# Patient Record
Sex: Male | Born: 1975 | Race: White | Hispanic: No | Marital: Single | Smoking: Never smoker
Health system: Southern US, Community
[De-identification: ages and names within clinical notes are randomized; demographics above are authoritative.]

## PROBLEM LIST (undated history)

## (undated) DIAGNOSIS — E119 Type 2 diabetes mellitus without complications: Secondary | ICD-10-CM

## (undated) DIAGNOSIS — K219 Gastro-esophageal reflux disease without esophagitis: Secondary | ICD-10-CM

## (undated) DIAGNOSIS — B019 Varicella without complication: Secondary | ICD-10-CM

## (undated) HISTORY — PX: SHOULDER SURGERY: SHX246

## (undated) HISTORY — DX: Varicella without complication: B01.9

## (undated) HISTORY — DX: Gastro-esophageal reflux disease without esophagitis: K21.9

---

## 2006-05-27 ENCOUNTER — Emergency Department (HOSPITAL_COMMUNITY): Admission: EM | Admit: 2006-05-27 | Discharge: 2006-05-27 | Payer: Self-pay | Admitting: Emergency Medicine

## 2008-09-11 ENCOUNTER — Encounter: Payer: Self-pay | Admitting: Gastroenterology

## 2008-10-07 ENCOUNTER — Encounter: Payer: Self-pay | Admitting: Gastroenterology

## 2008-10-19 ENCOUNTER — Ambulatory Visit: Payer: Self-pay | Admitting: Gastroenterology

## 2008-10-19 DIAGNOSIS — K219 Gastro-esophageal reflux disease without esophagitis: Secondary | ICD-10-CM | POA: Insufficient documentation

## 2008-10-19 DIAGNOSIS — R740 Nonspecific elevation of levels of transaminase and lactic acid dehydrogenase [LDH]: Secondary | ICD-10-CM

## 2008-10-19 DIAGNOSIS — R112 Nausea with vomiting, unspecified: Secondary | ICD-10-CM | POA: Insufficient documentation

## 2008-10-19 DIAGNOSIS — R7401 Elevation of levels of liver transaminase levels: Secondary | ICD-10-CM | POA: Insufficient documentation

## 2008-10-23 ENCOUNTER — Ambulatory Visit: Payer: Self-pay | Admitting: Gastroenterology

## 2008-10-23 ENCOUNTER — Ambulatory Visit (HOSPITAL_COMMUNITY): Admission: RE | Admit: 2008-10-23 | Discharge: 2008-10-23 | Payer: Self-pay | Admitting: Gastroenterology

## 2008-10-23 LAB — CONVERTED CEMR LAB
HDL: 23.3 mg/dL — ABNORMAL LOW (ref >39.0)
Total CHOL/HDL Ratio: 6.4

## 2010-07-28 ENCOUNTER — Emergency Department (HOSPITAL_COMMUNITY): Admission: EM | Admit: 2010-07-28 | Discharge: 2010-07-28 | Payer: Self-pay | Admitting: Emergency Medicine

## 2011-09-08 ENCOUNTER — Emergency Department (HOSPITAL_COMMUNITY)
Admission: EM | Admit: 2011-09-08 | Discharge: 2011-09-08 | Disposition: A | Discharge status: Home / Self Care | Payer: Worker's Compensation | Attending: Emergency Medicine | Admitting: Emergency Medicine

## 2011-09-08 ENCOUNTER — Emergency Department (HOSPITAL_COMMUNITY): Payer: Worker's Compensation

## 2011-09-08 DIAGNOSIS — S6980XA Other specified injuries of unspecified wrist, hand and finger(s), initial encounter: Secondary | ICD-10-CM | POA: Insufficient documentation

## 2011-09-08 DIAGNOSIS — Y990 Civilian activity done for income or pay: Secondary | ICD-10-CM | POA: Insufficient documentation

## 2011-09-08 DIAGNOSIS — M79609 Pain in unspecified limb: Secondary | ICD-10-CM | POA: Insufficient documentation

## 2011-09-08 DIAGNOSIS — S6990XA Unspecified injury of unspecified wrist, hand and finger(s), initial encounter: Secondary | ICD-10-CM | POA: Insufficient documentation

## 2011-09-08 DIAGNOSIS — X500XXA Overexertion from strenuous movement or load, initial encounter: Secondary | ICD-10-CM | POA: Insufficient documentation

## 2011-09-08 DIAGNOSIS — S6390XA Sprain of unspecified part of unspecified wrist and hand, initial encounter: Secondary | ICD-10-CM | POA: Insufficient documentation

## 2012-08-03 ENCOUNTER — Ambulatory Visit (INDEPENDENT_AMBULATORY_CARE_PROVIDER_SITE_OTHER): Payer: 59 | Admitting: Family Medicine

## 2012-08-03 VITALS — BP 132/88 | HR 61 | Temp 98.8°F | Resp 18 | Ht 74.0 in | Wt 310.0 lb

## 2012-08-03 DIAGNOSIS — L738 Other specified follicular disorders: Secondary | ICD-10-CM

## 2012-08-03 DIAGNOSIS — L739 Follicular disorder, unspecified: Secondary | ICD-10-CM

## 2012-08-03 DIAGNOSIS — L02419 Cutaneous abscess of limb, unspecified: Secondary | ICD-10-CM

## 2012-08-03 MED ORDER — DOXYCYCLINE HYCLATE 100 MG PO TABS: 100.0000 mg | ORAL_TABLET | Freq: Two times a day (BID) | ORAL | Status: AC

## 2012-08-03 MED ORDER — MUPIROCIN 2 % EX OINT: TOPICAL_OINTMENT | Freq: Three times a day (TID) | CUTANEOUS | Status: AC

## 2012-08-03 NOTE — Patient Instructions (Signed)
Take medication as ordered. If it looks at all worse at any time please return for recheck.

## 2012-08-03 NOTE — Progress Notes (Signed)
Subjective: Approximately one month ago the patient noticed a placed on his left leg. It came up and he eventually drained it, improve, then it got worse again the last few days. He drained it again. His last night secondary spots started opening up next to it. He didn't know if he got something in it or had been bitten by a spider. He also has some low placed in the back of his Be checked.  Objective: Folliculitis on the back of his neck. A small place was opened and cultured.  Left shin has a 3 mm moderately deep crater. Just. To that there is a new little crusted draining area. The surrounding tissue is red but not very hot looking. No deeper abscess can be palpated. A small amount of drainage to be obtained through the main cavity.  Assessment: Abscess left shin Folliculitis on neck  Plan: Cultured both places. This is suspicious for MRSA. Will cover with doxycycline and Bactroban. Return if worse.

## 2012-08-06 LAB — WOUND CULTURE
Gram Stain: NONE SEEN
Gram Stain: NONE SEEN
Gram Stain: NONE SEEN
Organism ID, Bacteria: NO GROWTH

## 2012-08-08 MED ORDER — CEPHALEXIN 500 MG PO TABS: 500.0000 mg | ORAL_TABLET | Freq: Three times a day (TID) | ORAL | Status: AC

## 2012-08-08 NOTE — Addendum Note (Signed)
Addended by: Johnnette Litter on: 08/08/2012 09:49 AM   Modules accepted: Orders

## 2013-07-24 ENCOUNTER — Emergency Department: Payer: Self-pay | Admitting: Internal Medicine

## 2013-07-24 LAB — URINALYSIS, COMPLETE
Glucose,UR: >=500 mg/dL (ref 0–75)
Leukocyte Esterase: NEGATIVE
Ph: 6 (ref 4.5–8.0)
RBC,UR: 102 /HPF (ref 0–5)
Specific Gravity: 1.008 (ref 1.003–1.030)
Squamous Epithelial: NONE SEEN

## 2013-07-24 LAB — BASIC METABOLIC PANEL
Chloride: 108 mmol/L — ABNORMAL HIGH (ref 98–107)
Creatinine: 0.92 mg/dL (ref 0.60–1.30)
EGFR (African American): >60
Glucose: 203 mg/dL — ABNORMAL HIGH (ref 65–99)
Potassium: 3.5 mmol/L (ref 3.5–5.1)
Sodium: 140 mmol/L (ref 136–145)

## 2013-07-24 LAB — CBC
HGB: 16.4 g/dL (ref 13.0–18.0)
Platelet: 247 10*3/uL (ref 150–440)

## 2016-06-04 ENCOUNTER — Emergency Department (HOSPITAL_COMMUNITY)
Admission: EM | Admit: 2016-06-04 | Discharge: 2016-06-04 | Disposition: A | Discharge status: Home / Self Care | Payer: Worker's Compensation | Attending: Emergency Medicine | Admitting: Emergency Medicine

## 2016-06-04 ENCOUNTER — Encounter (HOSPITAL_COMMUNITY): Payer: Self-pay | Admitting: Nurse Practitioner

## 2016-06-04 ENCOUNTER — Emergency Department (HOSPITAL_COMMUNITY): Payer: Worker's Compensation

## 2016-06-04 DIAGNOSIS — S8262XA Displaced fracture of lateral malleolus of left fibula, initial encounter for closed fracture: Secondary | ICD-10-CM | POA: Diagnosis not present

## 2016-06-04 DIAGNOSIS — S82892A Other fracture of left lower leg, initial encounter for closed fracture: Secondary | ICD-10-CM

## 2016-06-04 DIAGNOSIS — W101XXA Fall (on)(from) sidewalk curb, initial encounter: Secondary | ICD-10-CM | POA: Diagnosis not present

## 2016-06-04 DIAGNOSIS — S80211A Abrasion, right knee, initial encounter: Secondary | ICD-10-CM | POA: Insufficient documentation

## 2016-06-04 DIAGNOSIS — Y92481 Parking lot as the place of occurrence of the external cause: Secondary | ICD-10-CM | POA: Insufficient documentation

## 2016-06-04 DIAGNOSIS — Y999 Unspecified external cause status: Secondary | ICD-10-CM | POA: Diagnosis not present

## 2016-06-04 DIAGNOSIS — S99912A Unspecified injury of left ankle, initial encounter: Secondary | ICD-10-CM | POA: Diagnosis present

## 2016-06-04 DIAGNOSIS — Y939 Activity, unspecified: Secondary | ICD-10-CM | POA: Insufficient documentation

## 2016-06-04 DIAGNOSIS — Z791 Long term (current) use of non-steroidal anti-inflammatories (NSAID): Secondary | ICD-10-CM | POA: Insufficient documentation

## 2016-06-04 MED ORDER — IBUPROFEN 400 MG PO TABS
800.0000 mg | ORAL_TABLET | Freq: Once | ORAL | Status: AC
  Administered 2016-06-04: 800 mg via ORAL
  Filled 2016-06-04: qty 2

## 2016-06-04 MED ORDER — IBUPROFEN 800 MG PO TABS: 800.0000 mg | ORAL_TABLET | Freq: Four times a day (QID) | ORAL | Status: DC | PRN

## 2016-06-04 NOTE — Progress Notes (Signed)
Orthopedic Tech Progress Note Patient Details:  David HoesDavid J Tift Horton 02-06-1976 [id number] Applied CAM walker to LLE.  Pulses, sensation, motion intact before and after application.  Capillary refill less than 2 seconds before and after application. Ortho Devices Type of Ortho Device: CAM walker Ortho Device/Splint Location: LLE Ortho Device/Splint Interventions: Application   Lesle ChrisGilliland, Suhailah Kwan L 06/04/2016, 6:29 PM

## 2016-06-04 NOTE — Discharge Instructions (Signed)
You have a broken left ankle.  Please wear cam walker and follow up closely with orthopedist for further care.  Follow instruction below.   Walking Boot A walking boot (controlled ankle motion boot or CAM walker) is a removable boot-shaped splint that holds your foot or ankle in place after an injury or a medical procedure. This helps with healing and prevents further injury. A walking boot has a stiff, rigid outer frame that limits movement and supports your leg and foot. The inner lining is a layer of padded material. Walking boots usually have several adjustable straps to secure them over the foot. Your health care provider may prescribe a walking boot if it is okay for you to use your injured foot to support your body weight. How much you can walk with the boot on will depend on the type and severity of your injury. Your health care provider will recommend the best boot for you based on your condition. HOW DO I PUT ON MY WALKING BOOT? There are different types of walking boots. Each type of boot has specific instructions about how to wear it properly. Follow instructions from your health care provider about wearing yours. In general:  Sit down to put on your boot. This is more comfortable and it helps to prevent falls.  Open up the boot fully. Place your foot into the boot so that your heel rests against the back.  Your toes should be supported by the base of the boot, but they should not hang over the front.  Adjust the straps so the boot fits securely but is not too tight.  Do not bend the hard frame of the boot to get a good fit.  Ask someone to help you put on the boot, if needed. WHAT ARE SOME TIPS FOR WALKING WITH A WALKING BOOT?  Do not try to walk without wearing the boot unless your health care provider has approved.  Rest your injured leg as much as possible.  Use other assistive walking devices as told by your health care provider. These include crutches and canes.  On your  other foot, wear a shoe with a heel that is close to the height of the boot.  Be very careful when walking on surfaces that are uneven or wet. HOW CAN I REDUCE SWELLING?  Rest your injured foot or leg as much as possible.  If directed, apply ice to the injured area:  Put ice in a plastic bag.  Place a towel between your skin and the bag.  Leave the ice on for 20 minutes, 2-3 times a day for two days or as told by your health care provider.  Keep your injured leg raised (elevated) above the level of your heart for 2-3 hours each day or as told by your health care provider.  If swelling gets worse, loosen the boot and rest and raise your foot.  Contact your health care provider if swelling does not get better or if it gets worse over time. WHAT SKIN CARE PRACTICES SHOULD I FOLLOW?  Wear a long sock to protect your foot and leg from rubbing inside the boot.  Take off the boot one time per day to check the injured area.  Follow instructions from your health care provider about taking care of your incision or wound, if this applies.  Clean and wash the injured area as told by your health care provider.  Gently dry your foot and leg before putting the boot back on.  Contact your health care provider if a wound is getting worse or if your skin becomes red, painful, or irritated. ARE THERE ANY ACTIVITY RESTRICTIONS? Activity restrictions depend on the type and severity of your injury. Follow instructions from your health care provider.  Bathe and shower as directed by your health care provider.  Do not do activities that could make your injury worse.  Do not drive if your affected foot is one that you usually use for driving. HOW SHOULD I KEEP MY BOOT CLEAN?  Clean the frame and the liner of the boot by hand. Use a washcloth with mild soap and water.  Do not use chemical cleaning products. These could irritate your skin, especially if you have a wound or an incision.  Do not  soak the liner of the boot.  Do not put any part of the boot in a washing machine or a clothes dryer.  Allow the boot to air dry completely before you put it back on your foot.   This information is not intended to replace advice given to you by your health care provider. Make sure you discuss any questions you have with your health care provider.   Document Released: 04/17/2015 Document Reviewed: 04/17/2015 Elsevier Interactive Patient Education 2016 [employer]   Fibular Fracture With Rehab The fibula is the smaller of the two lower leg bones and is vulnerable to breaks (fracture). Fibular fractures may go fully through the bone (complete) or partially (incomplete). The bone fragments are rarely out of alignment (displaced fracture). Fibula fractures may occur anywhere along the bone. However, this document only discusses fractures that do not involve a leg joint. Fibular fractures are not often a severe injury because the bone supports only about 17% of the body weight. SYMPTOMS   Moderate to severe pain in the lower leg.  Tenderness and swelling in the leg or calf.  Bleeding and/or bruising (contusion) in the leg.  Inability to bear weight on the injured extremity.  Visible deformity, if the fracture is displaced.  Numbness and coldness in the leg and foot, beyond the fracture site, if blood supply is impaired. CAUSES  Fractures occur when a force is placed on the bone that is greater than it can withstand. Common causes of fibular fracture include:  Direct hit (trauma) (i.e., hockey or lacrosse check to the lower leg).  Stress fracture (weakening of the bone from repeated stress).  Indirect injury, caused by twisting, turning quickly, or violent muscle contraction. RISK INCREASES WITH:  Contact sports (i.e., football, soccer, lacrosse, hockey).  Sports that can cause twisted ankle injury (i.e., skiing, basketball).  Bony abnormalities (i.e., osteoporosis or bone  tumors).  Metabolism disorders, hormone problems, and nutrition deficiency and disorders (i.e., anorexia and bulimia).  Poor strength and flexibility. PREVENTION   Warm up and stretch properly before activity.  Maintain physical fitness:  Strength, flexibility, and endurance.  Cardiovascular fitness.  Wear properly fitted and padded protective equipment (i.e., shin guards for soccer). PROGNOSIS  If treated properly, fibular fractures usually heal in 4 to 6 weeks.  RELATED COMPLICATIONS   Failure of bone to heal (nonunion).  Bone heals in a poor position (malunion).  Increased pressure inside the leg (compartment syndrome) due to injury that disrupts the blood supply to the leg and foot and injures the nerves and muscles (uncommon).  Shortening of the injured bones.  Hindrance of normal bone growth in children.  Risks of surgery: infection, bleeding, injury to nerves (numbness, weakness, paralysis), need for  further surgery.  Longer healing time if activity is resumed too soon. TREATMENT Treatment first involves ice, medicine, and elevation of the leg to reduce pain and inflammation. People with fibular fractures are advised to walk using crutches. A brace or walking boot may be given to restrain the injured leg and allow for healing. Sometimes, surgery is needed to place a rod, plate, or screws in the bones in order to fix the fracture. After surgery, the leg is restrained. After restraint (with or without surgery), it is important to complete strengthening and stretching exercises to regain strength and a full range of motion. Exercises may be completed at home or with a therapist. MEDICATION   If pain medicine is needed, nonsteroidal anti-inflammatory medicines (aspirin and ibuprofen), or other minor pain relievers (acetaminophen), are often advised.  Do not take pain medicine for 7 days before surgery.  Prescription pain relievers may be given if your health care provider  thinks they are needed. Use only as directed and only as much as you need. SEEK MEDICAL CARE IF:  Symptoms get worse or do not improve in 2 weeks, despite treatment.  The following occur after restraint or surgery. (Report any of these signs immediately):  Swelling above or below the fracture site.  Severe, persistent pain.  Blue or gray skin below the fracture site, especially under the toenails. Numbness or loss of feeling below the fracture site.  New, unexplained symptoms develop. (Drugs used in treatment may produce side effects.) EXERCISES  RANGE OF MOTION (ROM) AND STRETCHING EXERCISES - Fibular Fracture These exercises may help you when beginning to recover from your injury. Your symptoms may go away with or without further involvement from your physician, physical therapist or athletic trainer. While completing these exercises, remember:   Restoring tissue flexibility helps normal motion to return to the joints. This allows healthier, less painful movement and activity.  An effective stretch should be held for at least 30 seconds.  A stretch should never be painful. You should only feel a gentle lengthening or release in the stretched tissue. RANGE OF MOTION - Dorsi/Plantar Flexion  While sitting with your right / left knee straight, draw the top of your foot upwards by flexing your ankle. Then reverse the motion, pointing your toes downward.  Hold each position for __________ seconds.  After completing your first set of exercises, repeat this exercise with your knee bent. Repeat __________ times. Complete this exercise __________ times per day.  STRETCH - Gastrocsoleus   Sit with your right / left leg extended. Holding onto both ends of a belt or towel, loop it around the ball of your foot.  Keeping your right / left ankle and foot relaxed and your knee straight, pull your foot and ankle toward you using the belt.  You should feel a gentle stretch behind your calf or  knee. Hold this position for __________ seconds. Repeat __________ times. Complete this stretch __________ times per day.  RANGE OF MOTION- Ankle Plantar Flexion   Sit with your right / left leg crossed over your opposite knee.  Use your opposite hand to pull the top of your foot and toes toward you.  You should feel a gentle stretch on the top of your foot and ankle. Hold this position for __________ seconds. Repeat __________ times. Complete __________ times per day.  RANGE OF MOTION - Ankle Eversion  Sit with your right / left ankle crossed over your opposite knee.  Grip your foot with your opposite hand,  placing your thumb on the top of your foot and your fingers across the bottom of your foot.  Gently push your foot downward with a slight rotation so your littlest toes rise slightly toward the ceiling.  You should feel a gentle stretch on the inside of your ankle. Hold the stretch for __________ seconds. Repeat __________ times. Complete this exercise __________ times per day.  RANGE OF MOTION - Ankle Inversion  Sit with your right / left ankle crossed over your opposite knee.  Grip your foot with your opposite hand, placing your thumb on the bottom of your foot and your fingers across the top of your foot.  Gently pull your foot so the smallest toe comes toward you and your thumb pushes the inside of the ball of your foot away from you.  You should feel a gentle stretch on the outside of your ankle. Hold the stretch for __________ seconds. Repeat __________ times. Complete this exercise __________ times per day.  RANGE OF MOTION - Ankle Alphabet  Imagine your right / left big toe is a pen.  Keeping your hip and knee still, write out the entire alphabet with your "pen." Make the letters as large as you can, without increasing any discomfort. Repeat __________ times. Complete this exercise __________ times per day.  RANGE OF MOTION - Ankle Dorsiflexion, Active  Assisted  Remove your shoes and sit on a chair, preferably not on a carpeted surface.  Place your right / left foot on the floor, directly under your knee. Extend your opposite leg for support.  Keeping your heel down, slide your right / left foot back toward the chair, until you feel a stretch at your ankle or calf. If you do not feel a stretch, slide your bottom forward to the edge of the chair, while still keeping your heel down.  Hold this stretch for __________ seconds. Repeat __________ times. Complete this stretch __________ times per day.  STRENGTHENING EXERCISES - Fibular Fracture These exercises may help you when beginning to recover from your injury. They may resolve your symptoms with or without further involvement from your physician, physical therapist or athletic trainer. While completing these exercises, remember:   Muscles can gain both the endurance and the strength needed for everyday activities through controlled exercises.  Complete these exercises as instructed by your physician, physical therapist or athletic trainer. Increase the resistance and repetitions only as guided.  You may experience muscle soreness or fatigue, but the pain or discomfort you are trying to eliminate should never worsen during these exercises. If this pain does get worse, stop and make certain you are following the directions exactly. If the pain is still present after adjustments, discontinue the exercise until you can discuss the trouble with your clinician. STRENGTH - Dorsiflexors  Secure a rubber exercise band or tubing to a fixed object (table, pole) and loop the other end around your right / left foot.  Sit on the floor, facing the fixed object. The band should be slightly tense when your foot is relaxed.  Slowly draw your foot back toward you, using your ankle and toes.  Hold this position for __________ seconds. Slowly release the tension in the band and return your foot to the starting  position. Repeat __________ times. Complete this exercise __________ times per day.  STRENGTH - Plantar-flexors  Sit with your right / left leg extended. Holding onto both ends of a rubber exercise band or tubing, loop it around the ball of your foot.  Keep a slight tension in the band.  Slowly push your toes away from you, pointing them downward.  Hold this position for __________ seconds. Return to the starting position slowly, controlling the tension in the band. Repeat __________ times. Complete this exercise __________ times per day.  STRENGTH - Plantar-flexors, Standing   Stand with your feet shoulder width apart. Place your hands on a wall or table to steady yourself, using as little support as needed.  Keeping your weight evenly spread over the width of your feet, rise up on your toes.*  Hold this position for __________ seconds. Repeat __________ times. Complete this exercise __________ times per day.  *If this is too easy, shift your weight toward your right / left leg until you feel challenged. Ultimately, you may be asked to do this exercise while standing on your right / left foot only. STRENGTH - Towel Curls  Sit in a chair, on a non-carpeted surface.  Place your foot on a towel, keeping your heel on the floor.  Pull the towel toward your heel only by curling your toes. Keep your heel on the floor.  If instructed by your physician, physical therapist or athletic trainer, add ____________________ at the end of the towel. Repeat __________ times. Complete this exercise __________ times per day. STRENGTH - Ankle Eversion  Secure one end of a rubber exercise band or tubing to a fixed object (table, pole). Loop the other end around your foot, just before your toes.  Place your fists between your knees. This will focus your strengthening at your ankle.  Drawing the band across your opposite foot, away from the pole, slowly pull your little toe out and up. Make sure the band  is positioned to resist the entire motion.  Hold this position for __________ seconds.  Return to the starting position slowly, controlling the tension in the band. Repeat __________ times. Complete this exercise __________ times per day.  STRENGTH - Ankle Inversion  Secure one end of a rubber exercise band or tubing to a fixed object (table, pole). Loop the other end around your foot, just before your toes.  Place your fists between your knees. This will focus your strengthening at your ankle.  Slowly, pull your big toe up and in, making sure the band is positioned to resist the entire motion.  Hold this position for __________ seconds.  Return to the starting position slowly, controlling the tension in the band. Repeat __________ times. Complete this exercises __________ times per day.    This information is not intended to replace advice given to you by your health care provider. Make sure you discuss any questions you have with your health care provider.   Document Released: 12/01/2005 Document Revised: 12/22/2014 Document Reviewed: 03/15/2009 Elsevier Interactive Patient Education [employer]

## 2016-06-04 NOTE — ED Notes (Signed)
Pt c/o onset L ankle pain after rolling his ankle on a parking lot curb this afternoon. Describes as a dull ache. He is concerned due to past history of injury to this ankle

## 2016-06-04 NOTE — ED Provider Notes (Signed)
CSN: [id number]     Arrival date & time 06/04/16  1643 History  By signing my name below, I, Soijett Blue, attest that this documentation has been prepared under the direction and in the presence of Fayrene HelperBowie Zissy Hamlett, PA-C Electronically Signed: Soijett Blue, ED Scribe. 06/04/2016. 5:24 PM.   Chief Complaint  Patient presents with  . Ankle Pain      The history is provided by the patient. No language interpreter was used.    David Horton is a 40 y.o. male who presents to the Emergency Department complaining of 8/10, constant, dull aching, non-radiating, left ankle pain, onset 1 hour ago. Pt states that he was ambulating when his left ankle rolled on a curb. Pt states that he fell into a car during the fall and obtained an abrasion to his right knee. Pt reports that he has fractured his left ankle in the past. Pt is having associated symptoms of left ankle swelling, gait problem due to pain, and abrasion to right knee. He notes that he has not tried any medications for the relief of his symptoms. He denies color change, wound, rash, and any other symptoms.    History reviewed. No pertinent past medical history. Past Surgical History  Procedure Laterality Date  . Shoulder surgery     History reviewed. No pertinent family history. Social History  Substance Use Topics  . Smoking status: Never Smoker   . Smokeless tobacco: None  . Alcohol Use: Yes    Review of Systems  Musculoskeletal: Positive for joint swelling (left ankle) and arthralgias (left ankle). Gait problem: due to pain.  Skin: Positive for wound (abrasion to right knee). Negative for color change and rash.      Allergies  Review of patient's allergies indicates no known allergies.  Home Medications   Prior to Admission medications   Medication Sig Start Date End Date Taking? Authorizing Provider  naproxen (NAPROSYN) 500 MG tablet Take 500 mg by mouth 2 (two) times daily as needed.    Historical Provider, MD   BP  151/104 mmHg  Pulse 102  Temp(Src) 99.1 F (37.3 C) (Oral)  Resp 17  SpO2 96% Physical Exam  Constitutional: He is oriented to person, place, and time. He appears well-developed and well-nourished. No distress.  HENT:  Head: Normocephalic and atraumatic.  Eyes: EOM are normal.  Neck: Neck supple.  Cardiovascular: Normal rate and intact distal pulses.   Pulmonary/Chest: Effort normal. No respiratory distress.  Abdominal: He exhibits no distension.  Musculoskeletal: Normal range of motion.       Left knee: Normal.       Left ankle: Tenderness. Lateral malleolus tenderness found.  Left ankle tenderness noted to lateral malleolus with moderate effusion noted anteriorly with TTP, but no crepitus. No pain at the 5th metatarsal regions. dorsalis pedis pulses palpable. Able to move all toes. Brisk cap refill. No tenderness to medial malleolus. Nl plantar and dorsi flexion. Calf compartment soft. Left knee nl. Small abrasion noted to right knee but able to bend and flex knee.   Neurological: He is alert and oriented to person, place, and time.  Skin: Skin is warm and dry.  Psychiatric: He has a normal mood and affect. His behavior is normal.  Nursing note and vitals reviewed.   ED Course  Procedures (including critical care time) DIAGNOSTIC STUDIES: Oxygen Saturation is 96% on RA, nl by my interpretation.    COORDINATION OF CARE: 5:19 PM Discussed treatment plan with pt at bedside which  includes ibuprofen and left ankle xray and pt agreed to plan.    Imaging Review Dg Ankle Complete Left  06/04/2016  CLINICAL DATA:  Injury to left ankle today when mistepped in parking lot at work and twisted ankle. Pain and swelling lateral malleolus. EXAM: LEFT ANKLE COMPLETE - 3+ VIEW COMPARISON:  12/13/2013 by report only FINDINGS: Subtle cortical irregularity at the tip of the lateral malleolus, possible minimally distracted or displaced avulsion injury. There is overlying soft tissue swelling.  Otherwise normal mineralization and alignment. The ankle mortise is intact. No other fracture is evident. Small calcaneal spurs. IMPRESSION: 1. Suspect minimally displaced avulsion fracture from the lateral malleolus. Correlate with point tenderness. Electronically Signed   By: Corlis Leak M.D.   On: 06/04/2016 17:56   I have personally reviewed and evaluated these images as part of my medical decision-making.    MDM   Final diagnoses:  Closed left ankle fracture, initial encounter    Patient X-Ray positive for minimally displaced avulsion fracture from lateral malleolus. Pt advised to follow up with orthopedics. Patient given cam-walker boot while in ED. Will discharge home with ibuprofen. Conservative therapy recommended and discussed. Patient will be discharged home & is agreeable with above plan. Returns precautions discussed. Pt appears safe for discharge.  Care discussed with Dr. Donnald Garre.   I personally performed the services described in this documentation, which was scribed in my presence. The recorded information has been reviewed and is accurate.       Fayrene Helper, PA-C 06/04/16 1812  Arby Barrette, MD 06/05/16 [phone]

## 2016-08-09 ENCOUNTER — Emergency Department (HOSPITAL_COMMUNITY)
Admission: EM | Admit: 2016-08-09 | Discharge: 2016-08-09 | Disposition: A | Discharge status: Home / Self Care | Payer: Worker's Compensation | Attending: Emergency Medicine | Admitting: Emergency Medicine

## 2016-08-09 ENCOUNTER — Encounter (HOSPITAL_COMMUNITY): Payer: Self-pay | Admitting: Emergency Medicine

## 2016-08-09 DIAGNOSIS — Y999 Unspecified external cause status: Secondary | ICD-10-CM | POA: Diagnosis not present

## 2016-08-09 DIAGNOSIS — Y939 Activity, unspecified: Secondary | ICD-10-CM | POA: Diagnosis not present

## 2016-08-09 DIAGNOSIS — Y929 Unspecified place or not applicable: Secondary | ICD-10-CM | POA: Insufficient documentation

## 2016-08-09 DIAGNOSIS — S60811A Abrasion of right wrist, initial encounter: Secondary | ICD-10-CM | POA: Insufficient documentation

## 2016-08-09 DIAGNOSIS — Z7721 Contact with and (suspected) exposure to potentially hazardous body fluids: Secondary | ICD-10-CM | POA: Diagnosis not present

## 2016-08-09 LAB — RAPID HIV SCREEN (HIV 1/2 AB+AG)
HIV 1/2 ANTIBODIES: NONREACTIVE
HIV-1 P24 ANTIGEN - HIV24: NONREACTIVE

## 2016-08-09 MED ORDER — IBUPROFEN 800 MG PO TABS
800.0000 mg | ORAL_TABLET | Freq: Once | ORAL | Status: AC
  Administered 2016-08-09: 800 mg via ORAL
  Filled 2016-08-09: qty 1

## 2016-08-09 MED ORDER — IBUPROFEN 800 MG PO TABS: 800.0000 mg | ORAL_TABLET | Freq: Three times a day (TID) | ORAL | 0 refills | Status: DC
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
## 2016-08-09 NOTE — ED Notes (Signed)
Per Lanier ClamCarolyn Chappin [facility] [phone] we need to draw HBV, HCV and HIV on both exposed patient and source patient and fax Exposure report to [phone]. Sgt Miller here assist

## 2016-08-09 NOTE — ED Notes (Signed)
Lab Results: HIV Non-reactive. RN made aware.

## 2016-08-09 NOTE — Discharge Instructions (Signed)
You were seen in the ED today with a low-risk blood exposure. You were HIV negative today. You will need to follow with your company and your PCP in the next 4-6 weeks for re-check of labs.   Return to the ED with any sudden worsening pain, fever, chills, or difficulty breathing.
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
## 2016-08-10 LAB — HEPATITIS B SURFACE ANTIGEN: HEP B S AG: NEGATIVE

## 2016-08-10 LAB — HEPATITIS C ANTIBODY

## 2016-08-14 LAB — HEPATITIS B SURFACE ANTIBODY,QUALITATIVE: Hep B S Ab: REACTIVE

## 2017-05-13 ENCOUNTER — Encounter (HOSPITAL_COMMUNITY): Payer: Self-pay

## 2017-05-13 ENCOUNTER — Emergency Department (HOSPITAL_COMMUNITY)
Admission: EM | Admit: 2017-05-13 | Discharge: 2017-05-13 | Disposition: A | Discharge status: Home / Self Care | Payer: Worker's Compensation | Attending: Emergency Medicine | Admitting: Emergency Medicine

## 2017-05-13 ENCOUNTER — Emergency Department (HOSPITAL_COMMUNITY): Payer: Worker's Compensation

## 2017-05-13 DIAGNOSIS — Y939 Activity, unspecified: Secondary | ICD-10-CM | POA: Diagnosis not present

## 2017-05-13 DIAGNOSIS — Y990 Civilian activity done for income or pay: Secondary | ICD-10-CM | POA: Insufficient documentation

## 2017-05-13 DIAGNOSIS — S46911A Strain of unspecified muscle, fascia and tendon at shoulder and upper arm level, right arm, initial encounter: Secondary | ICD-10-CM

## 2017-05-13 DIAGNOSIS — W502XXA Accidental twist by another person, initial encounter: Secondary | ICD-10-CM | POA: Insufficient documentation

## 2017-05-13 DIAGNOSIS — S4991XA Unspecified injury of right shoulder and upper arm, initial encounter: Secondary | ICD-10-CM | POA: Diagnosis present

## 2017-05-13 DIAGNOSIS — Y929 Unspecified place or not applicable: Secondary | ICD-10-CM | POA: Insufficient documentation

## 2017-05-13 MED ORDER — IBUPROFEN 800 MG PO TABS
800.0000 mg | ORAL_TABLET | Freq: Once | ORAL | Status: AC
  Administered 2017-05-13: 800 mg via ORAL
  Filled 2017-05-13: qty 1
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
## 2017-05-13 NOTE — Discharge Instructions (Signed)
As discussed, follow the RICE protocol and follow up with your orthopedic doctor if symptoms persist. Ibuprofen for pain and swelling. Return if symptoms worsen, you experience loss of sensation in that arm or any other concerning symptoms in the meantime.

## 2017-05-13 NOTE — ED Notes (Signed)
PT DISCHARGED. INSTRUCTIONS GIVEN. AAOX4. PT IN NO APPARENT DISTRESS WITH MILD PAIN. THE OPPORTUNITY TO ASK QUESTIONS WAS PROVIDED. 
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
## 2017-05-14 MED ORDER — BUPIVACAINE HCL (PF) 0.5 % IJ SOLN
INTRAMUSCULAR | Status: AC
  Filled 2017-05-14: qty 30

## 2017-05-14 MED ORDER — IOPAMIDOL (ISOVUE-300) INJECTION 61%
INTRAVENOUS | Status: AC
  Filled 2017-05-14: qty 50
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
## 2018-04-27 ENCOUNTER — Encounter (INDEPENDENT_AMBULATORY_CARE_PROVIDER_SITE_OTHER): Payer: Self-pay

## 2018-04-27 ENCOUNTER — Encounter: Payer: Self-pay | Admitting: Primary Care

## 2018-04-27 ENCOUNTER — Ambulatory Visit: Payer: Self-pay | Admitting: Primary Care

## 2018-04-27 VITALS — BP 140/88 | HR 72 | Temp 98.2°F | Ht 74.0 in | Wt 256.0 lb

## 2018-04-27 DIAGNOSIS — R030 Elevated blood-pressure reading, without diagnosis of hypertension: Secondary | ICD-10-CM | POA: Diagnosis not present

## 2018-04-27 DIAGNOSIS — N529 Male erectile dysfunction, unspecified: Secondary | ICD-10-CM

## 2018-04-27 DIAGNOSIS — Z23 Encounter for immunization: Secondary | ICD-10-CM | POA: Diagnosis not present

## 2018-04-27 DIAGNOSIS — Z113 Encounter for screening for infections with a predominantly sexual mode of transmission: Secondary | ICD-10-CM

## 2018-04-27 DIAGNOSIS — I10 Essential (primary) hypertension: Secondary | ICD-10-CM | POA: Insufficient documentation

## 2018-04-27 DIAGNOSIS — K219 Gastro-esophageal reflux disease without esophagitis: Secondary | ICD-10-CM

## 2018-04-27 DIAGNOSIS — Z0000 Encounter for general adult medical examination without abnormal findings: Secondary | ICD-10-CM | POA: Diagnosis not present

## 2018-04-27 LAB — COMPREHENSIVE METABOLIC PANEL
ALK PHOS: 52 U/L (ref 39–117)
ALT: 16 U/L (ref 0–53)
AST: 13 U/L (ref 0–37)
Albumin: 4.4 g/dL (ref 3.5–5.2)
BUN: 21 mg/dL (ref 6–23)
CO2: 27 mEq/L (ref 19–32)
Calcium: 9.4 mg/dL (ref 8.4–10.5)
Chloride: 106 mEq/L (ref 96–112)
Creatinine, Ser: 0.78 mg/dL (ref 0.40–1.50)
GFR: 117.42 mL/min (ref >60.00)
GLUCOSE: 194 mg/dL — AB (ref 70–99)
Potassium: 4.5 mEq/L (ref 3.5–5.1)
Sodium: 143 mEq/L (ref 135–145)
TOTAL PROTEIN: 7.4 g/dL (ref 6.0–8.3)
Total Bilirubin: 1.5 mg/dL — ABNORMAL HIGH (ref 0.2–1.2)

## 2018-04-27 LAB — LIPID PANEL
Cholesterol: 166 mg/dL (ref 0–200)
HDL: 37.5 mg/dL — AB (ref >39.00)
NONHDL: 128.44
TRIGLYCERIDES: 226 mg/dL — AB (ref 0.0–149.0)
Total CHOL/HDL Ratio: 4
VLDL: 45.2 mg/dL — AB (ref 0.0–40.0)

## 2018-04-27 LAB — LDL CHOLESTEROL, DIRECT: Direct LDL: 109 mg/dL

## 2018-04-27 NOTE — Progress Notes (Signed)
Subjective:    Patient ID: David Horton, male    DOB: 03-23-1976, 42 y.o.   MRN: [id number]  HPI  Mr. Abdou Stocks is a 42 year male who presents today to establish care and for complete physical. He would also like screening for STD's and testing for testosterone levels.   He's having difficulty maintaining an erection that has been noticeable since beocming intimate in a new relationship two weeks ago. He denies difficulty obtaining an erection. He's never experienced this problem before. He is divorced from his wife, has not had intercourse since around 2015.   Immunizations: -Tetanus: Due today   Diet: He endorses  Breakfast: Meal replacement shake Lunch: Fish, rice, vegetables, beans Dinner: Fish, rice, beans Snacks: Meal replacement shake Desserts: Occasional chocolate Beverages: Energy drink, water, occasional orange juice, some alcohol use  Exercise: Riding his bike 4 days week, works out at the gyn 4-6 times weekly.  Eye exam: Completed years ago Dental exam: No recent exam   Review of Systems  Constitutional: Negative for unexpected weight change.  HENT: Negative for rhinorrhea.   Respiratory: Negative for cough and shortness of breath.   Cardiovascular: Negative for chest pain.  Gastrointestinal: Negative for constipation and diarrhea.  Genitourinary: Negative for difficulty urinating.  Musculoskeletal: Negative for arthralgias and myalgias.  Skin: Negative for rash.  Allergic/Immunologic: Negative for environmental allergies.  Neurological: Negative for dizziness, numbness and headaches.  Psychiatric/Behavioral: The patient is not nervous/anxious.        Past Medical History:  Diagnosis Date  . Chicken pox   . GERD (gastroesophageal reflux disease)      Social History   Socioeconomic History  . Marital status: Divorced    Spouse name: Not on file  . Number of children: Not on file  . Years of education: Not on file  . Highest education  level: Not on file  Occupational History  . Not on file  Social Needs  . Financial resource strain: Not on file  . Food insecurity:    Worry: Not on file    Inability: Not on file  . Transportation needs:    Medical: Not on file    Non-medical: Not on file  Tobacco Use  . Smoking status: Never Smoker  . Smokeless tobacco: Never Used  Substance and Sexual Activity  . Alcohol use: Yes  . Drug use: No  . Sexual activity: Not on file  Lifestyle  . Physical activity:    Days per week: Not on file    Minutes per session: Not on file  . Stress: Not on file  Relationships  . Social connections:    Talks on phone: Not on file    Gets together: Not on file    Attends religious service: Not on file    Active member of club or organization: Not on file    Attends meetings of clubs or organizations: Not on file    Relationship status: Not on file  . Intimate partner violence:    Fear of current or ex partner: Not on file    Emotionally abused: Not on file    Physically abused: Not on file    Forced sexual activity: Not on file  Other Topics Concern  . Not on file  Social History Narrative   Divorced.   2 children.   Works for [employer].   Enjoys working out.     Past Surgical History:  Procedure Laterality Date  . SHOULDER  SURGERY Left 2000, 2011   Left torn Labrum    Family History  Problem Relation Age of Onset  . Diabetes Mellitus II Mother   . COPD Father   . Diabetes Mellitus II Father   . Diabetes Mellitus I Sister   . Diabetes Mellitus II Maternal Grandmother   . Alzheimer's disease Maternal Grandmother     No Known Allergies  No current outpatient medications on file prior to visit.   No current facility-administered medications on file prior to visit.     BP 140/88   Pulse 72   Temp 98.2 F (36.8 C) (Oral)   Ht  (1.88 m)   Wt 256 lb (116.1 kg)   SpO2 98%   BMI 32.87 kg/m    Objective:   Physical Exam    Constitutional: He is oriented to person, place, and time. He appears well-nourished.  HENT:  Right Ear: Tympanic membrane and ear canal normal.  Left Ear: Tympanic membrane and ear canal normal.  Nose: Nose normal. Right sinus exhibits no maxillary sinus tenderness and no frontal sinus tenderness. Left sinus exhibits no maxillary sinus tenderness and no frontal sinus tenderness.  Mouth/Throat: Oropharynx is clear and moist.  Eyes: Pupils are equal, round, and reactive to light. Conjunctivae and EOM are normal.  Neck: Neck supple. Carotid bruit is not present. No thyromegaly present.  Cardiovascular: Normal rate, regular rhythm and normal heart sounds.  Pulmonary/Chest: Effort normal and breath sounds normal. He has no wheezes. He has no rales.  Abdominal: Soft. Bowel sounds are normal. There is no tenderness.  Musculoskeletal: Normal range of motion.  Neurological: He is alert and oriented to person, place, and time. He has normal reflexes. No cranial nerve deficit.  Skin: Skin is warm and dry.  Psychiatric: He has a normal mood and affect.          Assessment & Plan:

## 2018-04-27 NOTE — Assessment & Plan Note (Signed)
Intermittent and improved since weight loss. He is aware of trigger foods.

## 2018-04-27 NOTE — Patient Instructions (Signed)
Stop by the lab prior to leaving today. I will notify you of your results once received.   Schedule another lab appointment to return between the hours of 8 am and 10 am for the testosterone draw.   Continue exercising. You should be getting 150 minutes of moderate intensity exercise weekly.  Continue to work on a healthy diet, congratulations on weight loss!  It was a pleasure to meet you today! Please don't hesitate to call or message me with any questions. Welcome to [facility]!    Preventive Care 18-39 Years, Male Preventive care refers to lifestyle choices and visits with your health care provider that can promote health and wellness. What does preventive care include?  A yearly physical exam. This is also called an annual well check.  Dental exams once or twice a year.  Routine eye exams. Ask your health care provider how often you should have your eyes checked.  Personal lifestyle choices, including: ? Daily care of your teeth and gums. ? Regular physical activity. ? Eating a healthy diet. ? Avoiding tobacco and drug use. ? Limiting alcohol use. ? Practicing safe sex. What happens during an annual well check? The services and screenings done by your health care provider during your annual well check will depend on your age, overall health, lifestyle risk factors, and family history of disease. Counseling Your health care provider may ask you questions about your:  Alcohol use.  Tobacco use.  Drug use.  Emotional well-being.  Home and relationship well-being.  Sexual activity.  Eating habits.  Work and work Statistician.  Screening You may have the following tests or measurements:  Height, weight, and BMI.  Blood pressure.  Lipid and cholesterol levels. These may be checked every 5 years starting at age 36.  Diabetes screening. This is done by checking your blood sugar (glucose) after you have not eaten for a while (fasting).  Skin check.  Hepatitis  C blood test.  Hepatitis B blood test.  Sexually transmitted disease (STD) testing.  Discuss your test results, treatment options, and if necessary, the need for more tests with your health care provider. Vaccines Your health care provider may recommend certain vaccines, such as:  Influenza vaccine. This is recommended every year.  Tetanus, diphtheria, and acellular pertussis (Tdap, Td) vaccine. You may need a Td booster every 10 years.  Varicella vaccine. You may need this if you have not been vaccinated.  HPV vaccine. If you are 70 or younger, you may need three doses over 6 months.  Measles, mumps, and rubella (MMR) vaccine. You may need at least one dose of MMR.You may also need a second dose.  Pneumococcal 13-valent conjugate (PCV13) vaccine. You may need this if you have certain conditions and have not been vaccinated.  Pneumococcal polysaccharide (PPSV23) vaccine. You may need one or two doses if you smoke cigarettes or if you have certain conditions.  Meningococcal vaccine. One dose is recommended if you are age 80-21 years and a first-year college student living in a residence hall, or if you have one of several medical conditions. You may also need additional booster doses.  Hepatitis A vaccine. You may need this if you have certain conditions or if you travel or work in places where you may be exposed to hepatitis A.  Hepatitis B vaccine. You may need this if you have certain conditions or if you travel or work in places where you may be exposed to hepatitis B.  Haemophilus influenzae type b (Hib) vaccine.  You may need this if you have certain risk factors.  Talk to your health care provider about which screenings and vaccines you need and how often you need them. This information is not intended to replace advice given to you by your health care provider. Make sure you discuss any questions you have with your health care provider. Document Released: 01/27/2002 Document  Revised: 08/20/2016 Document Reviewed: 10/02/2015 Elsevier Interactive Patient Education  [employer].

## 2018-04-27 NOTE — Assessment & Plan Note (Signed)
Slightly above goal in the office today, is working on continued weight loss through diet and exercise. Will have him continue weight loss efforts and monitor BP.

## 2018-04-27 NOTE — Assessment & Plan Note (Signed)
Td due, provided today. Commended him on weight loss as he's lost nearly 100 pounds through diet and exercise. Continue same. Exam unremarkable. Labs pending. Will add in STI testing and testosterone testing given decreased inability to maintain erection. He will return between 8 am and 10 am to have this drawn.  Follow up in 1 year for CPE.

## 2018-04-27 NOTE — Addendum Note (Signed)
Addended by: Tawnya Crook on: 04/27/2018 04:49 PM   Modules accepted: Orders

## 2018-04-28 ENCOUNTER — Encounter: Payer: Self-pay | Admitting: Primary Care

## 2018-04-28 ENCOUNTER — Other Ambulatory Visit: Payer: Self-pay | Admitting: Primary Care

## 2018-04-28 DIAGNOSIS — R739 Hyperglycemia, unspecified: Secondary | ICD-10-CM

## 2018-04-28 LAB — HEPATITIS C ANTIBODY
HEP C AB: NONREACTIVE
SIGNAL TO CUT-OFF: 0.02 (ref <1.00)

## 2018-04-28 LAB — HSV(HERPES SIMPLEX VRS) I + II AB-IGG
HSV 1 IGG, TYPE SPEC: 41.8 {index} — AB
HSV 2 IGG,TYPE SPECIFIC AB: <0.9 index

## 2018-04-28 LAB — HIV ANTIBODY (ROUTINE TESTING W REFLEX): HIV: NONREACTIVE

## 2018-04-28 LAB — RPR: RPR Ser Ql: NONREACTIVE

## 2018-04-29 ENCOUNTER — Encounter: Payer: Self-pay | Admitting: Primary Care

## 2018-04-29 ENCOUNTER — Other Ambulatory Visit (INDEPENDENT_AMBULATORY_CARE_PROVIDER_SITE_OTHER): Payer: 59

## 2018-04-29 DIAGNOSIS — R739 Hyperglycemia, unspecified: Secondary | ICD-10-CM | POA: Diagnosis not present

## 2018-04-29 DIAGNOSIS — N529 Male erectile dysfunction, unspecified: Secondary | ICD-10-CM

## 2018-04-29 LAB — POCT GLYCOSYLATED HEMOGLOBIN (HGB A1C): HEMOGLOBIN A1C: 7.1

## 2018-05-01 LAB — C. TRACHOMATIS/N. GONORRHOEAE RNA
C. TRACHOMATIS RNA, TMA: NOT DETECTED
N. gonorrhoeae RNA, TMA: NOT DETECTED

## 2018-05-01 LAB — TRICHOMONAS VAGINALIS RNA, QL,MALES: Trichomonas vaginalis RNA: NOT DETECTED

## 2018-05-01 LAB — HSV 1/2 AB (IGM), IFA W/RFLX TITER
HSV 1 IGM SCREEN: NEGATIVE
HSV 2 IGM SCREEN: NEGATIVE

## 2018-05-03 ENCOUNTER — Encounter: Payer: Self-pay | Admitting: Primary Care

## 2018-05-03 DIAGNOSIS — E119 Type 2 diabetes mellitus without complications: Secondary | ICD-10-CM

## 2018-05-03 LAB — TESTOS,TOTAL,FREE AND SHBG (FEMALE)
Free Testosterone: 63 pg/mL (ref 35.0–155.0)
Sex Hormone Binding: 26 nmol/L (ref 10–50)
TESTOSTERONE, TOTAL, LC-MS-MS: 373 ng/dL (ref 250–1100)

## 2018-05-04 MED ORDER — METFORMIN HCL 500 MG PO TABS: 500.0000 mg | ORAL_TABLET | Freq: Two times a day (BID) | ORAL | 1 refills | Status: DC

## 2018-06-16 ENCOUNTER — Encounter: Payer: Self-pay | Admitting: Primary Care

## 2018-07-28 ENCOUNTER — Encounter: Payer: Self-pay | Admitting: Primary Care

## 2018-07-28 ENCOUNTER — Ambulatory Visit: Payer: 59 | Admitting: Primary Care

## 2018-07-28 VITALS — BP 140/88 | HR 64 | Temp 98.1°F | Ht 74.0 in | Wt 251.8 lb

## 2018-07-28 DIAGNOSIS — I10 Essential (primary) hypertension: Secondary | ICD-10-CM | POA: Diagnosis not present

## 2018-07-28 DIAGNOSIS — M79675 Pain in left toe(s): Secondary | ICD-10-CM | POA: Diagnosis not present

## 2018-07-28 DIAGNOSIS — G8929 Other chronic pain: Secondary | ICD-10-CM | POA: Diagnosis not present

## 2018-07-28 DIAGNOSIS — E1165 Type 2 diabetes mellitus with hyperglycemia: Secondary | ICD-10-CM | POA: Insufficient documentation

## 2018-07-28 DIAGNOSIS — E119 Type 2 diabetes mellitus without complications: Secondary | ICD-10-CM | POA: Diagnosis not present

## 2018-07-28 LAB — MICROALBUMIN / CREATININE URINE RATIO
Creatinine,U: 87.5 mg/dL
Microalb Creat Ratio: 0.8 mg/g (ref 0.0–30.0)
Microalb, Ur: <0.7 mg/dL (ref 0.0–1.9)

## 2018-07-28 NOTE — Assessment & Plan Note (Signed)
Above goal on last visit and also today. Did recommend low dose treatment given hyperlipidemia and diabetes. He kindly declines treatment as he'd like to work on lifestyle.  Discussed long term effects of uncontrolled hypertension, he verbalized understanding.  Continue to monitor.

## 2018-07-28 NOTE — Assessment & Plan Note (Signed)
Diagnosed in May 2019, compliant to Metformin BID. He is working on his diet but is needing assistance. Referral placed to diabetes nutritionist for further assistance. Declines pneumonia vaccination. Urine microalbumin pending. Foot exam today. Discussed to schedule eye exam.  Discussed when to check glucose levels.   Refuses treatment for hypertension and lipids, would like to work on lifestyle changes. This seems reasonable.   Follow up in 6 months.

## 2018-07-28 NOTE — Patient Instructions (Signed)
Stop by the lab prior to leaving today. I will notify you of your results once received.   Check your blood sugars once daily rotating times: Before breakfast 2 hours after lunch 2 hours after dinner Bedtime  Your blood sugars should run between 90-130 fasting and below 150 2 hours after a meal. This would be an example of great control.  Continue exercising. You should be getting 150 minutes of moderate intensity exercise weekly.  You will be contacted regarding your referral to diabetes nutritionist.  Please let us know if you have not been contacted within one week.   Schedule an eye exam as discussed.  Please schedule a follow up appointment in 6 months for diabetes check.   It was a pleasure to see you today!   Diabetes Mellitus and Nutrition When you have diabetes (diabetes mellitus), it is very important to have healthy eating habits because your blood sugar (glucose) levels are greatly affected by what you eat and drink. Eating healthy foods in the appropriate amounts, at about the same times every day, can help you:  Control your blood glucose.  Lower your risk of heart disease.  Improve your blood pressure.  Reach or maintain a healthy weight.  Every person with diabetes is different, and each person has different needs for a meal plan. Your health care provider may recommend that you work with a diet and nutrition specialist (dietitian) to make a meal plan that is best for you. Your meal plan may vary depending on factors such as:  The calories you need.  The medicines you take.  Your weight.  Your blood glucose, blood pressure, and cholesterol levels.  Your activity level.  Other health conditions you have, such as heart or kidney disease.  How do carbohydrates affect me? Carbohydrates affect your blood glucose level more than any other type of food. Eating carbohydrates naturally increases the amount of glucose in your blood. Carbohydrate counting is a  method for keeping track of how many carbohydrates you eat. Counting carbohydrates is important to keep your blood glucose at a healthy level, especially if you use insulin or take certain oral diabetes medicines. It is important to know how many carbohydrates you can safely have in each meal. This is different for every person. Your dietitian can help you calculate how many carbohydrates you should have at each meal and for snack. Foods that contain carbohydrates include:  Bread, cereal, rice, pasta, and crackers.  Potatoes and corn.  Peas, beans, and lentils.  Milk and yogurt.  Fruit and juice.  Desserts, such as cakes, cookies, ice cream, and candy.  How does alcohol affect me? Alcohol can cause a sudden decrease in blood glucose (hypoglycemia), especially if you use insulin or take certain oral diabetes medicines. Hypoglycemia can be a life-threatening condition. Symptoms of hypoglycemia (sleepiness, dizziness, and confusion) are similar to symptoms of having too much alcohol. If your health care provider says that alcohol is safe for you, follow these guidelines:  Limit alcohol intake to no more than 1 drink per day for nonpregnant women and 2 drinks per day for men. One drink equals 12 oz of beer, 5 oz of wine, or 1 oz of hard liquor.  Do not drink on an empty stomach.  Keep yourself hydrated with water, diet soda, or unsweetened iced tea.  Keep in mind that regular soda, juice, and other mixers may contain a lot of sugar and must be counted as carbohydrates.  What are tips for following  this plan? Reading food labels  Start by checking the serving size on the label. The amount of calories, carbohydrates, fats, and other nutrients listed on the label are based on one serving of the food. Many foods contain more than one serving per package.  Check the total grams (g) of carbohydrates in one serving. You can calculate the number of servings of carbohydrates in one serving by  dividing the total carbohydrates by 15. For example, if a food has 30 g of total carbohydrates, it would be equal to 2 servings of carbohydrates.  Check the number of grams (g) of saturated and trans fats in one serving. Choose foods that have low or no amount of these fats.  Check the number of milligrams (mg) of sodium in one serving. Most people should limit total sodium intake to less than 2,300 mg per day.  Always check the nutrition information of foods labeled as "low-fat" or "nonfat". These foods may be higher in added sugar or refined carbohydrates and should be avoided.  Talk to your dietitian to identify your daily goals for nutrients listed on the label. Shopping  Avoid buying canned, premade, or processed foods. These foods tend to be high in fat, sodium, and added sugar.  Shop around the outside edge of the grocery store. This includes fresh fruits and vegetables, bulk grains, fresh meats, and fresh dairy. Cooking  Use low-heat cooking methods, such as baking, instead of high-heat cooking methods like deep frying.  Cook using healthy oils, such as olive, canola, or sunflower oil.  Avoid cooking with butter, cream, or high-fat meats. Meal planning  Eat meals and snacks regularly, preferably at the same times every day. Avoid going long periods of time without eating.  Eat foods high in fiber, such as fresh fruits, vegetables, beans, and whole grains. Talk to your dietitian about how many servings of carbohydrates you can eat at each meal.  Eat 4-6 ounces of lean protein each day, such as lean meat, chicken, fish, eggs, or tofu. 1 ounce is equal to 1 ounce of meat, chicken, or fish, 1 egg, or 1/4 cup of tofu.  Eat some foods each day that contain healthy fats, such as avocado, nuts, seeds, and fish. Lifestyle   Check your blood glucose regularly.  Exercise at least 30 minutes 5 or more days each week, or as told by your health care provider.  Take medicines as told by  your health care provider.  Do not use any products that contain nicotine or tobacco, such as cigarettes and e-cigarettes. If you need help quitting, ask your health care provider.  Work with a Veterinary surgeon or diabetes educator to identify strategies to manage stress and any emotional and social challenges. What are some questions to ask my health care provider?  Do I need to meet with a diabetes educator?  Do I need to meet with a dietitian?  What number can I call if I have questions?  When are the best times to check my blood glucose? Where to find more information:  American Diabetes Association: diabetes.org/food-and-fitness/food  Academy of Nutrition and Dietetics: https://www.vargas.com/  [employer] of Diabetes and Digestive and Kidney Diseases (NIH): FindJewelers.cz Summary  A healthy meal plan will help you control your blood glucose and maintain a healthy lifestyle.  Working with a diet and nutrition specialist (dietitian) can help you make a meal plan that is best for you.  Keep in mind that carbohydrates and alcohol have immediate effects on your blood glucose  levels. It is important to count carbohydrates and to use alcohol carefully. This information is not intended to replace advice given to you by your health care provider. Make sure you discuss any questions you have with your health care provider. Document Released: 08/28/2005 Document Revised: 01/05/2017 Document Reviewed: 01/05/2017 Elsevier Interactive Patient Education  [employer]2018 Elsevier Inc.

## 2018-07-28 NOTE — Progress Notes (Signed)
Subjective:    Patient ID: David Horton, male    DOB: 1976-09-10, 42 y.o.   MRN: [id number]  HPI  David Horton is a 42 year old male who presents today for follow up.  1) Type 2 Diabetes:  Current medications include: Metformin 500 mg BID  He is checking his glucose once daily at various times: Mid 100's to high 100's.  Last A1C: 7.1 in May 2019 Last Eye Exam: No recent exam.  Last Foot Exam: Due today.  Pneumonia Vaccination: Declines  ACE/ARB: Urine microalbumin pending Statin: None. LDL of 109 in May 2019   Diet currently consists of:  Breakfast: Lean protein, rice, vegetables  Lunch: Lean protein, rice, vegetables Dinner: Lean protein, rice, vegetables  Snacks: Protein shake Desserts: 3 days weekly Beverages: Water, diet soda, flavored water, alcohol socially   Exercise: Doing calisthenic exercise 3-5 times weekly   2) Essential Hypertension: Currently not managed on medication. He denies chest pain, dizziness, headaches, changes in vision.  BP Readings from Last 3 Encounters:  07/28/18 140/88  04/27/18 140/88  05/13/17 (!) 147/103    Wt Readings from Last 3 Encounters:  07/28/18 251 lb 12 oz (114.2 kg)  04/27/18 256 lb (116.1 kg)  05/13/17 250 lb (113.4 kg)      Review of Systems  Eyes: Negative for visual disturbance.  Respiratory: Negative for shortness of breath.   Cardiovascular: Negative for chest pain.  Musculoskeletal: Negative for joint swelling.       Chronic toe pain to left great toe, dorsal side along nail bed  Skin: Negative for color change.  Neurological: Negative for dizziness and headaches.       Past Medical History:  Diagnosis Date  . Chicken pox   . GERD (gastroesophageal reflux disease)      Social History   Socioeconomic History  . Marital status: Divorced    Spouse name: Not on file  . Number of children: Not on file  . Years of education: Not on file  . Highest education level: Not on file    Occupational History  . Not on file  Social Needs  . Financial resource strain: Not on file  . Food insecurity:    Worry: Not on file    Inability: Not on file  . Transportation needs:    Medical: Not on file    Non-medical: Not on file  Tobacco Use  . Smoking status: Never Smoker  . Smokeless tobacco: Never Used  Substance and Sexual Activity  . Alcohol use: Yes  . Drug use: No  . Sexual activity: Not on file  Lifestyle  . Physical activity:    Days per week: Not on file    Minutes per session: Not on file  . Stress: Not on file  Relationships  . Social connections:    Talks on phone: Not on file    Gets together: Not on file    Attends religious service: Not on file    Active member of club or organization: Not on file    Attends meetings of clubs or organizations: Not on file    Relationship status: Not on file  . Intimate partner violence:    Fear of current or ex partner: Not on file    Emotionally abused: Not on file    Physically abused: Not on file    Forced sexual activity: Not on file  Other Topics Concern  . Not on file  Social History Narrative  Divorced.   2 children.   [place] [employer]   Enjoys working out.     Past Surgical History:  Procedure Laterality Date  . SHOULDER SURGERY Left 2000, 2011   Left torn Labrum    Family History  Problem Relation Age of Onset  . Diabetes Mellitus Horton Mother   . COPD Father   . Diabetes Mellitus Horton Father   . Diabetes Mellitus I Sister   . Diabetes Mellitus Horton Maternal Grandmother   . Alzheimer's disease Maternal Grandmother     No Known Allergies  Current Outpatient Medications on File Prior to Visit  Medication Sig Dispense Refill  . metFORMIN (GLUCOPHAGE) 500 MG tablet Take 1 tablet (500 mg total) by mouth 2 (two) times daily with a meal. 180 tablet 1   No current facility-administered medications on file prior to visit.     BP 140/88   Pulse 64   Temp 98.1 F  (36.7 C) (Oral)   Ht 6\' 2"  (1.88 m)   Wt 251 lb 12 oz (114.2 kg)   SpO2 98%   BMI 32.32 kg/m    Objective:   Physical Exam  Constitutional: He appears well-nourished.  Cardiovascular: Normal rate and regular rhythm.  Respiratory: Effort normal and breath sounds normal.  Musculoskeletal:  No obvious infection or abnormality to left great toe  Skin: Skin is warm and dry. No erythema.           Assessment & Plan:

## 2018-07-29 LAB — HEMOGLOBIN A1C: HEMOGLOBIN A1C: 6.3 % (ref 4.6–6.5)

## 2018-07-29 LAB — URIC ACID: URIC ACID, SERUM: 4.9 mg/dL (ref 4.0–7.8)

## 2018-08-13 ENCOUNTER — Encounter: Payer: 59 | Attending: Primary Care | Admitting: Dietician

## 2018-08-13 ENCOUNTER — Encounter: Payer: Self-pay | Admitting: Dietician

## 2018-08-13 VITALS — Ht 74.0 in | Wt 246.7 lb

## 2018-08-13 DIAGNOSIS — E119 Type 2 diabetes mellitus without complications: Secondary | ICD-10-CM | POA: Insufficient documentation

## 2018-08-13 DIAGNOSIS — Z713 Dietary counseling and surveillance: Secondary | ICD-10-CM | POA: Insufficient documentation

## 2018-08-13 NOTE — Patient Instructions (Signed)
   Choose lean proteins and healhy carbs like whole grains, high fiber choices.  Eat every 4-5 hours as able, OK to use a protein/ meal replacement drink as needed to avoid going for long stretches without nourishment.

## 2018-08-13 NOTE — Progress Notes (Signed)
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 Auditory . Hands-on   Current weight: 245.9lbs Height: 6'2" Medications, supplements: reconciled list in medical record, Metformin is only prescription medication at this time.   Progress and evaluation: Patient reports diagnosis of diabetes 04/2018. He checks BGs 1-2x daily, with pre-meal readings ranging 120s - 150s; post-meal ranging 130s- 200. He reports both parent have Type 2 diabetes, and sister has Type 1 diabetes. Patient also voices concern over controllig BP and cholesterol.  He worked on weight loss with exercise and healthy food choices 2 years ago, and lost about 100lbs from his highest weight of about 355lbs. His goal is to maintain weight at this time, but decrease percent body fat. Patient is currently working 3rd shift, usually 11-hour shifts x4 days followed by 4 days off. (now on day 14 of 15-day stretch of work)  Physical activity: cardio, calisthenics, light weights 30-90 minutes, 4-6x a week.   Dietary Intake:  Usual eating pattern includes 2-3 meals and 1+ snacks per day. Dining out frequency: 5-8 meals per week.  Breakfast: none when coming off work shift Snack: peanut butter and apples, cereal, yogurt, fruit, Ramen noodles, 2% milk Lunch: 5am meal break at work fruit ie red grapefruit; chobani mixed berry greek yogurt, shrimp cocktail and imitation crab cocktail Snack: sleeping after working, or will snack throughout the day when off Supper: 6:30-7pm pasta and shrimp alfredo, scallops corn and pasta carbonara (frozen) Snack: apple Beverages: water, sugar free beverages Using some [place] MRE beverage, 2 scoops = 23.5g protein, 33.5g  CHO, <1g fat  Nutrition Care Education: Topics covered: diabetes, body composition Basic nutrition: basic food groups, appropriate nutrient balance, appropriate meal and snack schedule, general nutrition guidelines    Diabetes:  goals for BGs, appropriate meal and snack schedule, appropriate carb intake and balance; advised 40-45% calories from carbohydrate, 25-30% protein, and 30% fat with mostly healthy fat and discussed appropriate portions and intake with meals; calculated energy needs at 2900kcal for weight maintenance based on Mifflin-St Jeor formula. Patient had previous body comp test that estimated BMR at 2800kcal. Provided guidance for 2900kcal daily intake. Discussed proper treatment for hypoglycemia. Discussed other resources for managing diet and diabetes.  Hypertension: identifying food sources of potassium, magnesium; role of activity, stress Hyperlipidemia: healthy and unhealthy fats, role of fiber, food sources of fiber, phytochemicals   Nutritional Diagnosis:  [place]-2.2 Altered nutrition-related laboratory As related to type 2 Diabetes.  As evidenced by patient with HbA1C of 7.8% at time of diagnosis, working on diet changes to improve Bg control.  Intervention: Instruction as noted above.   Patient will continue to improve food choices and portions to control BGs and maintain or decrease body fat.    Set goals with direction from patient.   No follow-up scheduled at this time, patient will schedule later if needed.  Education Materials given:  . General diet guidelines for Diabetes . Plate Planner with food lists . Carb Counting and Meal Planning (Novo) . Sample meal pattern/ menus . Diabetes and You [employer]) . Goals/ instructions   Learner/ who was taught:  . Patient  . Spouse/ partner   Level of understanding: Marland Kitchen. Verbalizes/ demonstrates competency   Demonstrated degree of understanding via:  Teach back Learning barriers: . None  Willingness to learn/ readiness  for change: . Eager, change in progress   Monitoring and Evaluation:  Dietary intake, exercise, BG control, and body weight      follow up: prn

## 2018-11-16 DIAGNOSIS — E119 Type 2 diabetes mellitus without complications: Secondary | ICD-10-CM

## 2018-11-17 MED ORDER — METFORMIN HCL 500 MG PO TABS: 500.0000 mg | ORAL_TABLET | Freq: Two times a day (BID) | ORAL | 1 refills | Status: DC

## 2019-01-28 ENCOUNTER — Encounter: Payer: Self-pay | Admitting: Primary Care

## 2019-01-28 ENCOUNTER — Ambulatory Visit: Payer: 59 | Admitting: Primary Care

## 2019-01-28 VITALS — BP 128/84 | HR 60 | Temp 97.9°F | Ht 74.0 in | Wt 261.8 lb

## 2019-01-28 DIAGNOSIS — I10 Essential (primary) hypertension: Secondary | ICD-10-CM | POA: Diagnosis not present

## 2019-01-28 DIAGNOSIS — E119 Type 2 diabetes mellitus without complications: Secondary | ICD-10-CM

## 2019-01-28 LAB — POCT GLYCOSYLATED HEMOGLOBIN (HGB A1C): Hemoglobin A1C: 6.6 % — AB (ref 4.0–5.6)

## 2019-01-28 NOTE — Progress Notes (Signed)
Subjective:    Patient ID: David Horton, male    DOB: June 17, 1976, 43 y.o.   MRN: [id number]  HPI  David Horton is a 43 year old male who presents today for follow up of type 2 diabetes.  Current medications include: Metformin 500 mg BID  He is checking his blood glucose infrequently.    Last A1C: 6.3 in August 2019 Last Eye Exam: No recent exam  Last Foot Exam: Due in August 2019 ACE/ARB: Urine micro negative in August 2019 Statin: None  Diet currently consists of:  Breakfast: Fast food, granola cereal  Lunch: Fast food  Dinner: None Snacks: None Desserts: None  Beverages: Diet soda water, water, occasional alcohol   Exercise: He is exercising 7 days weekly at the gym  BP Readings from Last 3 Encounters:  01/28/19 128/84  07/28/18 140/88  04/27/18 140/88   Wt Readings from Last 3 Encounters:  01/28/19 261 lb 12 oz (118.7 kg)  08/13/18 246 lb 11.2 oz (111.9 kg)  07/28/18 251 lb 12 oz (114.2 kg)   The 10-year ASCVD risk score Denman George DC Jr., et al., 2013) is: 2.2%   Values used to calculate the score:     Age: 39 years     Sex: Male     Is Non-Hispanic African American: No     Diabetic: Yes     Tobacco smoker: No     Systolic Blood Pressure: 128 mmHg     Is BP treated: No     HDL Cholesterol: 37.5 mg/dL     Total Cholesterol: 166 mg/dL   Review of Systems  Respiratory: Negative for shortness of breath.   Cardiovascular: Negative for chest pain.  Neurological: Negative for dizziness, numbness and headaches.       Past Medical History:  Diagnosis Date  . Chicken pox   . GERD (gastroesophageal reflux disease)      Social History   Socioeconomic History  . Marital status: Divorced    Spouse name: Not on file  . Number of children: Not on file  . Years of education: Not on file  . Highest education level: Not on file  Occupational History  . Not on file  Social Needs  . Financial resource strain: Not on file  . Food insecurity:   Worry: Not on file    Inability: Not on file  . Transportation needs:    Medical: Not on file    Non-medical: Not on file  Tobacco Use  . Smoking status: Never Smoker  . Smokeless tobacco: Never Used  Substance and Sexual Activity  . Alcohol use: Yes    Comment: 2-4 drinks weekly  . Drug use: No  . Sexual activity: Not on file  Lifestyle  . Physical activity:    Days per week: Not on file    Minutes per session: Not on file  . Stress: Not on file  Relationships  . Social connections:    Talks on phone: Not on file    Gets together: Not on file    Attends religious service: Not on file    Active member of club or organization: Not on file    Attends meetings of clubs or organizations: Not on file    Relationship status: Not on file  . Intimate partner violence:    Fear of current or ex partner: Not on file    Emotionally abused: Not on file    Physically abused: Not on file  Forced sexual activity: Not on file  Other Topics Concern  . Not on file  Social History Narrative   Divorced.   2 children.   Works for [employer].   Enjoys working out.     Past Surgical History:  Procedure Laterality Date  . SHOULDER SURGERY Left 2000, 2011   Left torn Labrum    Family History  Problem Relation Age of Onset  . Diabetes Mellitus II Mother   . COPD Father   . Diabetes Mellitus II Father   . Diabetes Mellitus I Sister   . Diabetes Mellitus II Maternal Grandmother   . Alzheimer's disease Maternal Grandmother     No Known Allergies  Current Outpatient Medications on File Prior to Visit  Medication Sig Dispense Refill  . ibuprofen (ADVIL,MOTRIN) 800 MG tablet Take 800 mg by mouth every 8 (eight) hours as needed.    . metFORMIN (GLUCOPHAGE) 500 MG tablet Take 1 tablet (500 mg total) by mouth 2 (two) times daily with a meal. 180 tablet 1  . Multiple Vitamins-Minerals (MULTIVITAMIN ADULT PO) Take by mouth.     No current facility-administered  medications on file prior to visit.     BP 128/84 (BP Location: Left Arm, Patient Position: Sitting, Cuff Size: Large)   Pulse 60   Temp 97.9 F (36.6 C) (Oral)   Ht 6\' 2"  (1.88 m)   Wt 261 lb 12 oz (118.7 kg)   SpO2 99%   BMI 33.61 kg/m    Objective:   Physical Exam  Constitutional: He appears well-nourished.  Neck: Neck supple.  Cardiovascular: Normal rate and regular rhythm.  Respiratory: Effort normal and breath sounds normal.  Skin: Skin is warm and dry.  Psychiatric: He has a normal mood and affect.           Assessment & Plan:

## 2019-01-28 NOTE — Patient Instructions (Signed)
Stop by the lab prior to leaving today. I will notify you of your results once received.   Continue exercising. You should be getting 150 minutes of moderate intensity exercise weekly.  It is important that you improve your diet. Please limit carbohydrates in the form of white bread, rice, pasta, sweets, fast food, fried food, sugary drinks, etc. Increase your consumption of fresh fruits and vegetables, whole grains, lean protein.  Ensure you are consuming 64 ounces of water daily.  Please schedule a physical with me in 6 months. You may also schedule a lab only appointment 3-4 days prior. We will discuss your lab results in detail during your physical.  It was a pleasure to see you today!   Diabetes Mellitus and Nutrition, Adult When you have diabetes (diabetes mellitus), it is very important to have healthy eating habits because your blood sugar (glucose) levels are greatly affected by what you eat and drink. Eating healthy foods in the appropriate amounts, at about the same times every day, can help you:  Control your blood glucose.  Lower your risk of heart disease.  Improve your blood pressure.  Reach or maintain a healthy weight. Every person with diabetes is different, and each person has different needs for a meal plan. Your health care provider may recommend that you work with a diet and nutrition specialist (dietitian) to make a meal plan that is best for you. Your meal plan may vary depending on factors such as:  The calories you need.  The medicines you take.  Your weight.  Your blood glucose, blood pressure, and cholesterol levels.  Your activity level.  Other health conditions you have, such as heart or kidney disease. How do carbohydrates affect me? Carbohydrates, also called carbs, affect your blood glucose level more than any other type of food. Eating carbs naturally raises the amount of glucose in your blood. Carb counting is a method for keeping track of how  many carbs you eat. Counting carbs is important to keep your blood glucose at a healthy level, especially if you use insulin or take certain oral diabetes medicines. It is important to know how many carbs you can safely have in each meal. This is different for every person. Your dietitian can help you calculate how many carbs you should have at each meal and for each snack. Foods that contain carbs include:  Bread, cereal, rice, pasta, and crackers.  Potatoes and corn.  Peas, beans, and lentils.  Milk and yogurt.  Fruit and juice.  Desserts, such as cakes, cookies, ice cream, and candy. How does alcohol affect me? Alcohol can cause a sudden decrease in blood glucose (hypoglycemia), especially if you use insulin or take certain oral diabetes medicines. Hypoglycemia can be a life-threatening condition. Symptoms of hypoglycemia (sleepiness, dizziness, and confusion) are similar to symptoms of having too much alcohol. If your health care provider says that alcohol is safe for you, follow these guidelines:  Limit alcohol intake to no more than 1 drink per day for nonpregnant women and 2 drinks per day for men. One drink equals 12 oz of beer, 5 oz of wine, or 1 oz of hard liquor.  Do not drink on an empty stomach.  Keep yourself hydrated with water, diet soda, or unsweetened iced tea.  Keep in mind that regular soda, juice, and other mixers may contain a lot of sugar and must be counted as carbs. What are tips for following this plan?  Reading food labels  Start by  checking the serving size on the "Nutrition Facts" label of packaged foods and drinks. The amount of calories, carbs, fats, and other nutrients listed on the label is based on one serving of the item. Many items contain more than one serving per package.  Check the total grams (g) of carbs in one serving. You can calculate the number of servings of carbs in one serving by dividing the total carbs by 15. For example, if a food  has 30 g of total carbs, it would be equal to 2 servings of carbs.  Check the number of grams (g) of saturated and trans fats in one serving. Choose foods that have low or no amount of these fats.  Check the number of milligrams (mg) of salt (sodium) in one serving. Most people should limit total sodium intake to less than 2,300 mg per day.  Always check the nutrition information of foods labeled as "low-fat" or "nonfat". These foods may be higher in added sugar or refined carbs and should be avoided.  Talk to your dietitian to identify your daily goals for nutrients listed on the label. Shopping  Avoid buying canned, premade, or processed foods. These foods tend to be high in fat, sodium, and added sugar.  Shop around the outside edge of the grocery store. This includes fresh fruits and vegetables, bulk grains, fresh meats, and fresh dairy. Cooking  Use low-heat cooking methods, such as baking, instead of high-heat cooking methods like deep frying.  Cook using healthy oils, such as olive, canola, or sunflower oil.  Avoid cooking with butter, cream, or high-fat meats. Meal planning  Eat meals and snacks regularly, preferably at the same times every day. Avoid going long periods of time without eating.  Eat foods high in fiber, such as fresh fruits, vegetables, beans, and whole grains. Talk to your dietitian about how many servings of carbs you can eat at each meal.  Eat 4-6 ounces (oz) of lean protein each day, such as lean meat, chicken, fish, eggs, or tofu. One oz of lean protein is equal to: ? 1 oz of meat, chicken, or fish. ? 1 egg. ?  cup of tofu.  Eat some foods each day that contain healthy fats, such as avocado, nuts, seeds, and fish. Lifestyle  Check your blood glucose regularly.  Exercise regularly as told by your health care provider. This may include: ? 150 minutes of moderate-intensity or vigorous-intensity exercise each week. This could be brisk walking, biking,  or water aerobics. ? Stretching and doing strength exercises, such as yoga or weightlifting, at least 2 times a week.  Take medicines as told by your health care provider.  Do not use any products that contain nicotine or tobacco, such as cigarettes and e-cigarettes. If you need help quitting, ask your health care provider.  Work with a Veterinary surgeon or diabetes educator to identify strategies to manage stress and any emotional and social challenges. Questions to ask a health care provider  Do I need to meet with a diabetes educator?  Do I need to meet with a dietitian?  What number can I call if I have questions?  When are the best times to check my blood glucose? Where to find more information:  American Diabetes Association: diabetes.org  Academy of Nutrition and Dietetics: www.eatright.[employer] of Diabetes and Digestive and Kidney Diseases (NIH): CarFlippers.tn Summary  A healthy meal plan will help you control your blood glucose and maintain a healthy lifestyle.  Working with a diet  and nutrition specialist (dietitian) can help you make a meal plan that is best for you.  Keep in mind that carbohydrates (carbs) and alcohol have immediate effects on your blood glucose levels. It is important to count carbs and to use alcohol carefully. This information is not intended to replace advice given to you by your health care provider. Make sure you discuss any questions you have with your health care provider. Document Released: 08/28/2005 Document Revised: 07/01/2017 Document Reviewed: 01/05/2017 Elsevier Interactive Patient Education  2019 [employer].

## 2019-01-28 NOTE — Assessment & Plan Note (Signed)
Improved and at goal today. Commended him on regular exercise. Encouraged to continue exercising and to work on diet. Continue to monitor.

## 2019-01-28 NOTE — Assessment & Plan Note (Signed)
Repeat A1C pending. Commended him on regular exercise, encouraged to work on diet. Urine microalbumin negative in August 2019. Not managed on statin as he has declined in the past, repeat lipids next visit. Foot exam UTD. He will schedule an eye exam. Continue Metformin as prescribed for now.  Follow up in 6 months.

## 2019-03-08 NOTE — Telephone Encounter (Signed)
David Horton, will you please get this patient set up for a video or phone visit?

## 2019-03-09 ENCOUNTER — Telehealth (INDEPENDENT_AMBULATORY_CARE_PROVIDER_SITE_OTHER): Payer: 59 | Admitting: Primary Care

## 2019-03-09 ENCOUNTER — Other Ambulatory Visit: Payer: Self-pay

## 2019-03-09 DIAGNOSIS — M549 Dorsalgia, unspecified: Secondary | ICD-10-CM | POA: Insufficient documentation

## 2019-03-09 DIAGNOSIS — M545 Low back pain, unspecified: Secondary | ICD-10-CM

## 2019-03-09 MED ORDER — CYCLOBENZAPRINE HCL 10 MG PO TABS: 5.0000 mg | ORAL_TABLET | Freq: Three times a day (TID) | ORAL | 0 refills | Status: DC | PRN

## 2019-03-09 NOTE — Assessment & Plan Note (Signed)
Symptoms representative of muscle spasm.  Other differentials include renal stone which is lower on the list, especially with the lack of cardinal symptoms as noted in HPI.  Negative cauda equina symptoms. Discussed to reduce amount of ibuprofen to a max dose of 800 mg every 8 hours.  Also discussed to avoid taking other NSAIDs with ibuprofen.  Will treat with Flexeril 5 to 10 mg every 8 hours as needed, drowsiness precautions provided.  We also discussed the need for heat, stretching/movement.  He will update if no improvement.

## 2019-03-09 NOTE — Progress Notes (Signed)
   Tito Dine II - 43 y.o. male  MRN [id number]  Date of Birth: 1976/11/12  PCP: Doreene Nest, NP  This service was provided via telemedicine. Phone Visit performed on 03/09/2019    Rationale for phone visit reviewed. Patient consented to telephone encounter.    Location of patient: Home Location of provider:  [place] [facility] Name of referring provider: N/A   Names of persons and role in encounter: Provider: Doreene Nest, NP  Patient: Tito Dine II  Other: N/A   Time on call: 6 minutes 42 seconds   Subjective: CC: Muscle Spasm/Back Pain HPI:  Mr. Leventhal is a 43 year old male who presents today via phone with a chief complaint of muscle spasm/back pain.  He sent a message via my chart yesterday with reports of back pain flare from an old injury with inability to stand upright.  He had been taking 800 mg of ibuprofen without improvement.  Today via phone who reports right lower back pain. 14 years go he "herniated" the discs between L4-5. Two mornings ago he woke up and noticed his pain with inability to stand erect. Yesterday he noticed an increase in muscle spasms/pain which was intolerable. He describes his pain as tightness and sharp, radiation to right gluteus and right upper back. Historically he's been able to calm his symptoms down with conservative care including rest and Ibuprofen, this has not been the case over the last 48 hours.  He denies dysuria, hematuria, difficulty urinating, radiculopathy/numbness/tingling, injury/trauma. He's tried applying heat. He's mostly laying and resting throughout the day.    Objective/Observations:   No physical exam or vital signs collected unless specifically identified.  Respiratory status: speaks in complete sentences without evident shortness of breath.   Assessment/Plan:  Symptoms representative of muscle spasm.  Other differentials include renal stone which is lower on the list, especially with  the lack of cardinal symptoms as noted in HPI.  Negative cauda equina symptoms. Discussed to reduce amount of ibuprofen to a max dose of 800 mg every 8 hours.  Also discussed to avoid taking other NSAIDs with ibuprofen.  Will treat with Flexeril 5 to 10 mg every 8 hours as needed, drowsiness precautions provided.  We also discussed the need for heat, stretching/movement.  He will update if no improvement.  No problem-specific Assessment & Plan notes found for this encounter.   I discussed the assessment and treatment plan with the patient. The patient was provided an opportunity to ask questions and all were answered. The patient agreed with the plan and demonstrated an understanding of the instructions.  Lab Orders  No laboratory test(s) ordered today    No orders of the defined types were placed in this encounter.   The patient was advised to call back or seek an in-person evaluation if the symptoms worsen or if the condition fails to improve as anticipated.  Doreene Nest, NP

## 2019-03-11 ENCOUNTER — Ambulatory Visit: Payer: 59 | Admitting: Primary Care

## 2019-03-11 ENCOUNTER — Other Ambulatory Visit: Payer: Self-pay

## 2019-03-11 ENCOUNTER — Encounter: Payer: Self-pay | Admitting: Primary Care

## 2019-03-11 DIAGNOSIS — M545 Low back pain, unspecified: Secondary | ICD-10-CM

## 2019-03-11 MED ORDER — METHYLPREDNISOLONE ACETATE 80 MG/ML IJ SUSP
80.0000 mg | Freq: Once | INTRAMUSCULAR | Status: AC
  Administered 2019-03-11: 80 mg via INTRAMUSCULAR

## 2019-03-11 MED ORDER — KETOROLAC TROMETHAMINE 60 MG/2ML IM SOLN
60.0000 mg | Freq: Once | INTRAMUSCULAR | Status: AC
  Administered 2019-03-11: 60 mg via INTRAMUSCULAR

## 2019-03-11 NOTE — Assessment & Plan Note (Signed)
Continued, slightly improved with Flexeril.  Do suspect MSK involvement given lack of other cardinal signs. No alarm signs. IM Depo Medrol 80 mg and Toradol 60 mg provided today. Continue to work on stretching, heat, Flexeril. He will update.

## 2019-03-11 NOTE — Addendum Note (Signed)
Addended by: Tawnya Crook on: 03/11/2019 08:37 AM   Modules accepted: Orders

## 2019-03-11 NOTE — Patient Instructions (Signed)
Continue cyclobenzaprine (Flexeril), Ibuprofen, stretching, heating pad.   It was a pleasure to see you today!

## 2019-03-11 NOTE — Progress Notes (Signed)
Subjective:    Patient ID: David Horton, male    DOB: July 24, 1976, 43 y.o.   MRN: [id number]  HPI  Mr. Majed Colgrove is a 43 year old male who presents today with a chief complaint of back pain. He was last evaluated via telemedicine on 03/09/19 with reports of right mid and lower back pain with inability to straighten his back. He endorsed a prior back injury 14 years ago and has since had a few spasms to the right back that is typically able to be managed with conservative care.  During his telemedicine visit his symptoms were more suspicious for MSK involvement so he was provided with cyclobenzaprine 10 mg tablets to use as needed. We also discussed stretching/heat.  Since his telemedicine visit he's had some improvement with Flexeril and Ibuprofen, but is still in a moderate amount of pain, mostly to the right lower back with intermittent radiation to right mid thoracic back. He denies urinary symptoms, changes in bowel/bladder control, injury/trauma. His symptoms began Monday morning this week, remembers waking during the night Sunday when repositioning and felt a sharp pain. He was able to fall back asleep. He describes his pain as stabbing. He's still unable to stand up erect. He's had no recent imaging of his lower back.   Review of Systems  Musculoskeletal: Positive for back pain and myalgias.  Skin: Negative for color change.  Neurological: Negative for weakness and numbness.       Past Medical History:  Diagnosis Date  . Chicken pox   . GERD (gastroesophageal reflux disease)      Social History   Socioeconomic History  . Marital status: Divorced    Spouse name: Not on file  . Number of children: Not on file  . Years of education: Not on file  . Highest education level: Not on file  Occupational History  . Not on file  Social Needs  . Financial resource strain: Not on file  . Food insecurity:    Worry: Not on file    Inability: Not on file  . Transportation  needs:    Medical: Not on file    Non-medical: Not on file  Tobacco Use  . Smoking status: Never Smoker  . Smokeless tobacco: Never Used  Substance and Sexual Activity  . Alcohol use: Yes    Comment: 2-4 drinks weekly  . Drug use: No  . Sexual activity: Not on file  Lifestyle  . Physical activity:    Days per week: Not on file    Minutes per session: Not on file  . Stress: Not on file  Relationships  . Social connections:    Talks on phone: Not on file    Gets together: Not on file    Attends religious service: Not on file    Active member of club or organization: Not on file    Attends meetings of clubs or organizations: Not on file    Relationship status: Not on file  . Intimate partner violence:    Fear of current or ex partner: Not on file    Emotionally abused: Not on file    Physically abused: Not on file    Forced sexual activity: Not on file  Other Topics Concern  . Not on file  Social History Narrative   Divorced.   2 children.   Works for [employer].   Enjoys working out.     Past Surgical History:  Procedure Laterality Date  .  SHOULDER SURGERY Left 2000, 2011   Left torn Labrum    Family History  Problem Relation Age of Onset  . Diabetes Mellitus II Mother   . COPD Father   . Diabetes Mellitus II Father   . Diabetes Mellitus I Sister   . Diabetes Mellitus II Maternal Grandmother   . Alzheimer's disease Maternal Grandmother     No Known Allergies  Current Outpatient Medications on File Prior to Visit  Medication Sig Dispense Refill  . cyclobenzaprine (FLEXERIL) 10 MG tablet Take 0.5-1 tablets (5-10 mg total) by mouth 3 (three) times daily as needed for muscle spasms. 30 tablet 0  . ibuprofen (ADVIL,MOTRIN) 800 MG tablet Take 800 mg by mouth every 8 (eight) hours as needed.    . metFORMIN (GLUCOPHAGE) 500 MG tablet Take 1 tablet (500 mg total) by mouth 2 (two) times daily with a meal. 180 tablet 1  . Multiple  Vitamins-Minerals (MULTIVITAMIN ADULT PO) Take by mouth.     No current facility-administered medications on file prior to visit.     BP (!) 150/94   Pulse 61   Temp 98.2 F (36.8 C) (Oral)   Ht 6\' 2"  (1.88 m)   Wt 258 lb 4 oz (117.1 kg)   SpO2 99%   BMI 33.16 kg/m    Objective:   Physical Exam  Constitutional: He appears well-nourished.  Appears uncomfortable  Musculoskeletal:     Lumbar back: He exhibits decreased range of motion, pain and spasm. He exhibits no tenderness and no bony tenderness.       Back:     Comments: Moderate decrease in ROM with lumbar flexion, unable to stand up completely erect. Negative straight leg raise bilaterally.   Skin: Skin is warm and dry.           Assessment & Plan:

## 2019-04-27 DIAGNOSIS — H0015 Chalazion left lower eyelid: Secondary | ICD-10-CM | POA: Diagnosis not present

## 2019-05-25 ENCOUNTER — Other Ambulatory Visit: Payer: Self-pay | Admitting: Primary Care

## 2019-05-25 DIAGNOSIS — E119 Type 2 diabetes mellitus without complications: Secondary | ICD-10-CM

## 2019-08-17 LAB — HM DIABETES EYE EXAM

## 2019-08-24 ENCOUNTER — Other Ambulatory Visit: Payer: Self-pay | Admitting: Primary Care

## 2019-08-24 DIAGNOSIS — E119 Type 2 diabetes mellitus without complications: Secondary | ICD-10-CM

## 2019-08-27 ENCOUNTER — Encounter: Payer: Self-pay | Admitting: Primary Care

## 2019-11-29 ENCOUNTER — Other Ambulatory Visit: Payer: Self-pay | Admitting: Primary Care

## 2019-11-29 DIAGNOSIS — E119 Type 2 diabetes mellitus without complications: Secondary | ICD-10-CM

## 2019-12-17 ENCOUNTER — Other Ambulatory Visit: Payer: Self-pay

## 2019-12-17 DIAGNOSIS — E119 Type 2 diabetes mellitus without complications: Secondary | ICD-10-CM

## 2019-12-25 ENCOUNTER — Other Ambulatory Visit: Payer: Self-pay | Admitting: Primary Care

## 2019-12-25 DIAGNOSIS — E119 Type 2 diabetes mellitus without complications: Secondary | ICD-10-CM

## 2020-01-29 ENCOUNTER — Other Ambulatory Visit: Payer: Self-pay | Admitting: Primary Care

## 2020-01-29 DIAGNOSIS — E119 Type 2 diabetes mellitus without complications: Secondary | ICD-10-CM

## 2020-03-06 ENCOUNTER — Other Ambulatory Visit: Payer: Self-pay | Admitting: Primary Care

## 2020-03-06 DIAGNOSIS — E119 Type 2 diabetes mellitus without complications: Secondary | ICD-10-CM

## 2020-03-06 NOTE — Telephone Encounter (Signed)
Must schedule appt first/thx dmf 

## 2020-05-11 ENCOUNTER — Other Ambulatory Visit: Payer: Self-pay

## 2020-05-11 ENCOUNTER — Encounter: Payer: Self-pay | Admitting: Primary Care

## 2020-05-11 ENCOUNTER — Ambulatory Visit: Payer: 59 | Admitting: Primary Care

## 2020-05-11 VITALS — BP 140/92 | HR 88 | Temp 96.0°F | Ht 74.0 in | Wt 270.2 lb

## 2020-05-11 DIAGNOSIS — E119 Type 2 diabetes mellitus without complications: Secondary | ICD-10-CM | POA: Diagnosis not present

## 2020-05-11 DIAGNOSIS — I10 Essential (primary) hypertension: Secondary | ICD-10-CM

## 2020-05-11 LAB — LIPID PANEL
Cholesterol: 166 mg/dL (ref 0–200)
HDL: 32.6 mg/dL — ABNORMAL LOW (ref >39.00)
NonHDL: 133.07
Total CHOL/HDL Ratio: 5
Triglycerides: 302 mg/dL — ABNORMAL HIGH (ref 0.0–149.0)
VLDL: 60.4 mg/dL — ABNORMAL HIGH (ref 0.0–40.0)

## 2020-05-11 LAB — COMPREHENSIVE METABOLIC PANEL
ALT: 69 U/L — ABNORMAL HIGH (ref 0–53)
AST: 33 U/L (ref 0–37)
Albumin: 4.6 g/dL (ref 3.5–5.2)
Alkaline Phosphatase: 64 U/L (ref 39–117)
BUN: 17 mg/dL (ref 6–23)
CO2: 26 mEq/L (ref 19–32)
Calcium: 9.4 mg/dL (ref 8.4–10.5)
Chloride: 101 mEq/L (ref 96–112)
Creatinine, Ser: 0.8 mg/dL (ref 0.40–1.50)
GFR: 106.21 mL/min (ref >60.00)
Glucose, Bld: 277 mg/dL — ABNORMAL HIGH (ref 70–99)
Potassium: 3.9 mEq/L (ref 3.5–5.1)
Sodium: 137 mEq/L (ref 135–145)
Total Bilirubin: 1 mg/dL (ref 0.2–1.2)
Total Protein: 7 g/dL (ref 6.0–8.3)

## 2020-05-11 LAB — POCT GLYCOSYLATED HEMOGLOBIN (HGB A1C): Hemoglobin A1C: 9.5 % — AB (ref 4.0–5.6)

## 2020-05-11 LAB — MICROALBUMIN / CREATININE URINE RATIO
Creatinine,U: 163.3 mg/dL
Microalb Creat Ratio: 0.4 mg/g (ref 0.0–30.0)
Microalb, Ur: 0.7 mg/dL (ref 0.0–1.9)

## 2020-05-11 LAB — LDL CHOLESTEROL, DIRECT: Direct LDL: 99 mg/dL

## 2020-05-11 MED ORDER — METFORMIN HCL 500 MG PO TABS: 500.0000 mg | ORAL_TABLET | Freq: Two times a day (BID) | ORAL | 3 refills | Status: DC

## 2020-05-11 NOTE — Assessment & Plan Note (Signed)
Above goal in the office today, also during prior visit. He does not check BP at home or work.  Discussed the likely need for medication treatment, he kindly declines. He will monitor BP at home and notify if readings remain at or above 135/90.  Follow up in 3 months.

## 2020-05-11 NOTE — Assessment & Plan Note (Signed)
No medication for two months.  A1C today of 9.5.  Resume Metformin. Work on diet and exercise. Urine microalbumin pending Foot exam today. Pneumonia vaccination next visit. Checking lipid panel.  Follow up in 3 months.

## 2020-05-11 NOTE — Patient Instructions (Signed)
Stop by the lab prior to leaving today. I will notify you of your results once received.   Resume metformin 500 mg twice daily. Check your blood sugars if possible.   It's important to improve your diet by reducing consumption of fast food, fried food, processed snack foods, sugary drinks. Increase consumption of fresh vegetables and fruits, whole grains, water.  Ensure you are drinking 64 ounces of water daily.  Start exercising. You should be getting 150 minutes of moderate intensity exercise weekly.  Please schedule a follow up appointment in 3 months for diabetes and blood pressure check.  It was a pleasure to see you today!   Diabetes Mellitus and Nutrition, Adult When you have diabetes (diabetes mellitus), it is very important to have healthy eating habits because your blood sugar (glucose) levels are greatly affected by what you eat and drink. Eating healthy foods in the appropriate amounts, at about the same times every day, can help you:  Control your blood glucose.  Lower your risk of heart disease.  Improve your blood pressure.  Reach or maintain a healthy weight. Every person with diabetes is different, and each person has different needs for a meal plan. Your health care provider may recommend that you work with a diet and nutrition specialist (dietitian) to make a meal plan that is best for you. Your meal plan may vary depending on factors such as:  The calories you need.  The medicines you take.  Your weight.  Your blood glucose, blood pressure, and cholesterol levels.  Your activity level.  Other health conditions you have, such as heart or kidney disease. How do carbohydrates affect me? Carbohydrates, also called carbs, affect your blood glucose level more than any other type of food. Eating carbs naturally raises the amount of glucose in your blood. Carb counting is a method for keeping track of how many carbs you eat. Counting carbs is important to keep your  blood glucose at a healthy level, especially if you use insulin or take certain oral diabetes medicines. It is important to know how many carbs you can safely have in each meal. This is different for every person. Your dietitian can help you calculate how many carbs you should have at each meal and for each snack. Foods that contain carbs include:  Bread, cereal, rice, pasta, and crackers.  Potatoes and corn.  Peas, beans, and lentils.  Milk and yogurt.  Fruit and juice.  Desserts, such as cakes, cookies, ice cream, and candy. How does alcohol affect me? Alcohol can cause a sudden decrease in blood glucose (hypoglycemia), especially if you use insulin or take certain oral diabetes medicines. Hypoglycemia can be a life-threatening condition. Symptoms of hypoglycemia (sleepiness, dizziness, and confusion) are similar to symptoms of having too much alcohol. If your health care provider says that alcohol is safe for you, follow these guidelines:  Limit alcohol intake to no more than 1 drink per day for nonpregnant women and 2 drinks per day for men. One drink equals 12 oz of beer, 5 oz of wine, or 1 oz of hard liquor.  Do not drink on an empty stomach.  Keep yourself hydrated with water, diet soda, or unsweetened iced tea.  Keep in mind that regular soda, juice, and other mixers may contain a lot of sugar and must be counted as carbs. What are tips for following this plan?  Reading food labels  Start by checking the serving size on the "Nutrition Facts" label of packaged foods  and drinks. The amount of calories, carbs, fats, and other nutrients listed on the label is based on one serving of the item. Many items contain more than one serving per package.  Check the total grams (g) of carbs in one serving. You can calculate the number of servings of carbs in one serving by dividing the total carbs by 15. For example, if a food has 30 g of total carbs, it would be equal to 2 servings of  carbs.  Check the number of grams (g) of saturated and trans fats in one serving. Choose foods that have low or no amount of these fats.  Check the number of milligrams (mg) of salt (sodium) in one serving. Most people should limit total sodium intake to less than 2,300 mg per day.  Always check the nutrition information of foods labeled as "low-fat" or "nonfat". These foods may be higher in added sugar or refined carbs and should be avoided.  Talk to your dietitian to identify your daily goals for nutrients listed on the label. Shopping  Avoid buying canned, premade, or processed foods. These foods tend to be high in fat, sodium, and added sugar.  Shop around the outside edge of the grocery store. This includes fresh fruits and vegetables, bulk grains, fresh meats, and fresh dairy. Cooking  Use low-heat cooking methods, such as baking, instead of high-heat cooking methods like deep frying.  Cook using healthy oils, such as olive, canola, or sunflower oil.  Avoid cooking with butter, cream, or high-fat meats. Meal planning  Eat meals and snacks regularly, preferably at the same times every day. Avoid going long periods of time without eating.  Eat foods high in fiber, such as fresh fruits, vegetables, beans, and whole grains. Talk to your dietitian about how many servings of carbs you can eat at each meal.  Eat 4-6 ounces (oz) of lean protein each day, such as lean meat, chicken, fish, eggs, or tofu. One oz of lean protein is equal to: ? 1 oz of meat, chicken, or fish. ? 1 egg. ?  cup of tofu.  Eat some foods each day that contain healthy fats, such as avocado, nuts, seeds, and fish. Lifestyle  Check your blood glucose regularly.  Exercise regularly as told by your health care provider. This may include: ? 150 minutes of moderate-intensity or vigorous-intensity exercise each week. This could be brisk walking, biking, or water aerobics. ? Stretching and doing strength  exercises, such as yoga or weightlifting, at least 2 times a week.  Take medicines as told by your health care provider.  Do not use any products that contain nicotine or tobacco, such as cigarettes and e-cigarettes. If you need help quitting, ask your health care provider.  Work with a Social worker or diabetes educator to identify strategies to manage stress and any emotional and social challenges. Questions to ask a health care provider  Do I need to meet with a diabetes educator?  Do I need to meet with a dietitian?  What number can I call if I have questions?  When are the best times to check my blood glucose? Where to find more information:  American Diabetes Association: diabetes.org  Academy of Nutrition and Dietetics: www.eatright.[employer] of Diabetes and Digestive and Kidney Diseases (NIH): [employer] Summary  A healthy meal plan will help you control your blood glucose and maintain a healthy lifestyle.  Working with a diet and nutrition specialist (dietitian) can help you make a meal plan that  is best for you.  Keep in mind that carbohydrates (carbs) and alcohol have immediate effects on your blood glucose levels. It is important to count carbs and to use alcohol carefully. This information is not intended to replace advice given to you by your health care provider. Make sure you discuss any questions you have with your health care provider. Document Revised: 11/13/2017 Document Reviewed: 01/05/2017 Elsevier Patient Education  2020 [employer].

## 2020-05-11 NOTE — Progress Notes (Signed)
Subjective:    Patient ID: David Horton, male    DOB: 29-Sep-1976, 44 y.o.   MRN: [id number]  HPI  This visit occurred during the SARS-CoV-2 public health emergency.  Safety protocols were in place, including screening questions prior to the visit, additional usage of staff PPE, and extensive cleaning of exam room while observing appropriate contact time as indicated for disinfecting solutions.   David Horton is a 44 year old male with a history of hypertension, type 2 diabetes who presents today for follow up.  1) Type 2 Diabetes:  Current medications include: Metformin 500 mg BID. He has been out of Metformin since March 2021 due to inability to schedule a follow up visit.   He is checking his blood glucose 0 times daily.  Last A1C: 6.6 in February 2020 Last Eye Exam: Completed in August 2020 Last Foot Exam: Due today Pneumonia Vaccination: Never completed ACE/ARB: None. Urine microalbumin pending. Statin: None. Lipid panel pending.  2) Essential Hypertension: Chronic which has improved with healthy lifestyle changes previously. Not currently managed on treatment.   He denies chest pain, dizziness, headaches.   BP Readings from Last 3 Encounters:  05/11/20 (!) 140/92  03/11/19 (!) 150/94  01/28/19 128/84      Review of Systems  Eyes: Negative for visual disturbance.  Respiratory: Negative for shortness of breath.   Cardiovascular: Negative for chest pain.  Neurological: Negative for dizziness, numbness and headaches.       Past Medical History:  Diagnosis Date  . Chicken pox   . GERD (gastroesophageal reflux disease)      Social History   Socioeconomic History  . Marital status: Divorced    Spouse name: Not on file  . Number of children: Not on file  . Years of education: Not on file  . Highest education level: Not on file  Occupational History  . Not on file  Tobacco Use  . Smoking status: Never Smoker  . Smokeless tobacco: Never Used    Substance and Sexual Activity  . Alcohol use: Yes    Comment: 2-4 drinks weekly  . Drug use: No  . Sexual activity: Not on file  Other Topics Concern  . Not on file  Social History Narrative   Divorced.   2 children.   Works for [employer].   Enjoys working out.    Social Determinants of Health   Financial Resource Strain:   . Difficulty of Paying Living Expenses:   Food Insecurity:   . Worried About Charity fundraiser in the Last Year:   . Arboriculturist in the Last Year:   Transportation Needs:   . Film/video editor (Medical):   Marland Kitchen Lack of Transportation (Non-Medical):   Physical Activity:   . Days of Exercise per Week:   . Minutes of Exercise per Session:   Stress:   . Feeling of Stress :   Social Connections:   . Frequency of Communication with Friends and Family:   . Frequency of Social Gatherings with Friends and Family:   . Attends Religious Services:   . Active Member of Clubs or Organizations:   . Attends Archivist Meetings:   Marland Kitchen Marital Status:   Intimate Partner Violence:   . Fear of Current or Ex-Partner:   . Emotionally Abused:   Marland Kitchen Physically Abused:   . Sexually Abused:     Past Surgical History:  Procedure Laterality Date  . SHOULDER SURGERY  Left 2000, 2011   Left torn Labrum    Family History  Problem Relation Age of Onset  . Diabetes Mellitus Horton Mother   . COPD Father   . Diabetes Mellitus Horton Father   . Diabetes Mellitus I Sister   . Diabetes Mellitus Horton Maternal Grandmother   . Alzheimer's disease Maternal Grandmother     No Known Allergies  Current Outpatient Medications on File Prior to Visit  Medication Sig Dispense Refill  . metFORMIN (GLUCOPHAGE) 500 MG tablet Take 1 tablet (500 mg total) by mouth 2 (two) times daily with a meal. NEED APPOINTMENT FOR ANY MORE REFILLS 60 tablet 0  . Multiple Vitamins-Minerals (MULTIVITAMIN ADULT PO) Take by mouth.     No current facility-administered  medications on file prior to visit.    BP (!) 140/92   Pulse 88   Temp (!) 96 F (35.6 C) (Temporal)   Ht 6\' 2"  (1.88 m)   Wt 270 lb 4 oz (122.6 kg)   SpO2 98%   BMI 34.70 kg/m    Objective:   Physical Exam  Constitutional: He appears well-nourished.  Cardiovascular: Normal rate and regular rhythm.  Respiratory: Effort normal and breath sounds normal.  Musculoskeletal:     Cervical back: Neck supple.  Skin: Skin is warm and dry.  Psychiatric: He has a normal mood and affect.           Assessment & Plan:

## 2020-05-17 ENCOUNTER — Ambulatory Visit: Payer: 59 | Admitting: Primary Care

## 2020-08-06 ENCOUNTER — Ambulatory Visit: Payer: 59 | Admitting: Primary Care

## 2020-08-06 ENCOUNTER — Other Ambulatory Visit: Payer: Self-pay

## 2020-08-06 ENCOUNTER — Encounter: Payer: Self-pay | Admitting: Primary Care

## 2020-08-06 VITALS — BP 144/94 | HR 82 | Temp 96.0°F | Ht 74.0 in | Wt 266.5 lb

## 2020-08-06 DIAGNOSIS — I10 Essential (primary) hypertension: Secondary | ICD-10-CM

## 2020-08-06 DIAGNOSIS — E119 Type 2 diabetes mellitus without complications: Secondary | ICD-10-CM | POA: Diagnosis not present

## 2020-08-06 LAB — POCT GLYCOSYLATED HEMOGLOBIN (HGB A1C): Hemoglobin A1C: 7.6 % — AB (ref 4.0–5.6)

## 2020-08-06 NOTE — Assessment & Plan Note (Signed)
Improved with A1C of 7.6 today. Commended him on dietary changes, encouraged regular exercise.  Continue Metformin 500 mg BID. Repeat lipids next visit. Declines pneumonia vaccine. Foot and eye exams UTD.  Repeat labs again in 3 months.

## 2020-08-06 NOTE — Assessment & Plan Note (Signed)
Above goal in the office today, home readings improved. Discussed the importance of monitoring to prevent heart disease from uncontrolled BP.

## 2020-08-06 NOTE — Patient Instructions (Signed)
Continue to monitor blood pressure. It should run less than 130/90 on a consistent basis.  Continue to work on a healthy diet. Ensure you are consuming 64 ounces of water daily.  Continue exercising. You should be getting 150 minutes of moderate intensity exercise weekly.  Schedule a lab appointment for 3 months for cholesterol and A1C check.  It was a pleasure to see you today!

## 2020-08-06 NOTE — Progress Notes (Signed)
Subjective:    Patient ID: David Horton, male    DOB: 1976/12/09, 44 y.o.   MRN: [id number]  HPI  This visit occurred during the SARS-CoV-2 public health emergency.  Safety protocols were in place, including screening questions prior to the visit, additional usage of staff PPE, and extensive cleaning of exam room while observing appropriate contact time as indicated for disinfecting solutions.   David Horton is a 44 year old male with a history of hypertension, type 2 diabetes who presents today for follow up of diabetes.  Current medications include: Metformin 500 mg BID.  He is checking his blood glucose 0 times daily.   Last A1C: 9.5 in May 2021 Last Eye Exam: UTD Last Foot Exam: UTD Pneumonia Vaccination: Due, declines  ACE/ARB: None. Urine micro negative in May 2021 Statin: None.   BP Readings from Last 3 Encounters:  08/06/20 (!) 144/94  05/11/20 (!) 140/92  03/11/19 (!) 150/94   He is checking his BP at home which is running 120's-140's/70's80's.   Wt Readings from Last 3 Encounters:  08/06/20 266 lb 8 oz (120.9 kg)  05/11/20 270 lb 4 oz (122.6 kg)  03/11/19 258 lb 4 oz (117.1 kg)     Review of Systems  Eyes: Negative for visual disturbance.  Respiratory: Negative for shortness of breath.   Cardiovascular: Negative for chest pain.  Neurological: Negative for dizziness and headaches.       Past Medical History:  Diagnosis Date  . Chicken pox   . GERD (gastroesophageal reflux disease)      Social History   Socioeconomic History  . Marital status: Divorced    Spouse name: Not on file  . Number of children: Not on file  . Years of education: Not on file  . Highest education level: Not on file  Occupational History  . Not on file  Tobacco Use  . Smoking status: Never Smoker  . Smokeless tobacco: Never Used  Substance and Sexual Activity  . Alcohol use: Yes    Comment: 2-4 drinks weekly  . Drug use: No  . Sexual activity: Not on file    Other Topics Concern  . Not on file  Social History Narrative   Divorced.   2 children.   Works for [employer].   Enjoys working out.    Social Determinants of Health   Financial Resource Strain:   . Difficulty of Paying Living Expenses: Not on file  Food Insecurity:   . Worried About Programme researcher, broadcasting/film/video in the Last Year: Not on file  . Ran Out of Food in the Last Year: Not on file  Transportation Needs:   . Lack of Transportation (Medical): Not on file  . Lack of Transportation (Non-Medical): Not on file  Physical Activity:   . Days of Exercise per Week: Not on file  . Minutes of Exercise per Session: Not on file  Stress:   . Feeling of Stress : Not on file  Social Connections:   . Frequency of Communication with Friends and Family: Not on file  . Frequency of Social Gatherings with Friends and Family: Not on file  . Attends Religious Services: Not on file  . Active Member of Clubs or Organizations: Not on file  . Attends Banker Meetings: Not on file  . Marital Status: Not on file  Intimate Partner Violence:   . Fear of Current or Ex-Partner: Not on file  . Emotionally Abused: Not on  file  . Physically Abused: Not on file  . Sexually Abused: Not on file    Past Surgical History:  Procedure Laterality Date  . SHOULDER SURGERY Left 2000, 2011   Left torn Labrum    Family History  Problem Relation Age of Onset  . Diabetes Mellitus II Mother   . COPD Father   . Diabetes Mellitus II Father   . Diabetes Mellitus I Sister   . Diabetes Mellitus II Maternal Grandmother   . Alzheimer's disease Maternal Grandmother     No Known Allergies  Current Outpatient Medications on File Prior to Visit  Medication Sig Dispense Refill  . metFORMIN (GLUCOPHAGE) 500 MG tablet Take 1 tablet (500 mg total) by mouth 2 (two) times daily with a meal. For diabetes. 180 tablet 3  . Multiple Vitamins-Minerals (MULTIVITAMIN ADULT PO) Take by mouth.      No current facility-administered medications on file prior to visit.    BP (!) 144/94   Pulse 82   Temp (!) 96 F (35.6 C) (Temporal)   Ht 6\' 2"  (1.88 m)   Wt 266 lb 8 oz (120.9 kg)   SpO2 98%   BMI 34.22 kg/m    Objective:   Physical Exam Cardiovascular:     Rate and Rhythm: Normal rate and regular rhythm.  Pulmonary:     Effort: Pulmonary effort is normal.     Breath sounds: Normal breath sounds.  Musculoskeletal:     Cervical back: Neck supple.  Skin:    General: Skin is warm and dry.  Psychiatric:        Mood and Affect: Mood normal.            Assessment & Plan:

## 2020-09-07 ENCOUNTER — Emergency Department (HOSPITAL_BASED_OUTPATIENT_CLINIC_OR_DEPARTMENT_OTHER)
Admission: EM | Admit: 2020-09-07 | Discharge: 2020-09-07 | Disposition: A | Discharge status: Home / Self Care | Payer: 59 | Attending: Emergency Medicine | Admitting: Emergency Medicine

## 2020-09-07 ENCOUNTER — Encounter (HOSPITAL_BASED_OUTPATIENT_CLINIC_OR_DEPARTMENT_OTHER): Payer: Self-pay

## 2020-09-07 ENCOUNTER — Other Ambulatory Visit: Payer: Self-pay

## 2020-09-07 ENCOUNTER — Emergency Department (HOSPITAL_BASED_OUTPATIENT_CLINIC_OR_DEPARTMENT_OTHER): Payer: 59

## 2020-09-07 DIAGNOSIS — I10 Essential (primary) hypertension: Secondary | ICD-10-CM | POA: Insufficient documentation

## 2020-09-07 DIAGNOSIS — E119 Type 2 diabetes mellitus without complications: Secondary | ICD-10-CM | POA: Insufficient documentation

## 2020-09-07 DIAGNOSIS — N200 Calculus of kidney: Secondary | ICD-10-CM | POA: Diagnosis not present

## 2020-09-07 DIAGNOSIS — R109 Unspecified abdominal pain: Secondary | ICD-10-CM | POA: Diagnosis present

## 2020-09-07 DIAGNOSIS — Z7984 Long term (current) use of oral hypoglycemic drugs: Secondary | ICD-10-CM | POA: Diagnosis not present

## 2020-09-07 HISTORY — DX: Type 2 diabetes mellitus without complications: E11.9

## 2020-09-07 LAB — COMPREHENSIVE METABOLIC PANEL
ALT: 72 U/L — ABNORMAL HIGH (ref 0–44)
AST: 36 U/L (ref 15–41)
Albumin: 4.6 g/dL (ref 3.5–5.0)
Alkaline Phosphatase: 63 U/L (ref 38–126)
Anion gap: 14 (ref 5–15)
BUN: 23 mg/dL — ABNORMAL HIGH (ref 6–20)
CO2: 21 mmol/L — ABNORMAL LOW (ref 22–32)
Calcium: 9.7 mg/dL (ref 8.9–10.3)
Chloride: 101 mmol/L (ref 98–111)
Creatinine, Ser: 0.82 mg/dL (ref 0.61–1.24)
GFR calc Af Amer: >60 mL/min (ref >60)
GFR calc non Af Amer: >60 mL/min (ref >60)
Glucose, Bld: 245 mg/dL — ABNORMAL HIGH (ref 70–99)
Potassium: 3.8 mmol/L (ref 3.5–5.1)
Sodium: 136 mmol/L (ref 135–145)
Total Bilirubin: 1.7 mg/dL — ABNORMAL HIGH (ref 0.3–1.2)
Total Protein: 7.9 g/dL (ref 6.5–8.1)

## 2020-09-07 LAB — URINALYSIS, ROUTINE W REFLEX MICROSCOPIC
Bilirubin Urine: NEGATIVE
Glucose, UA: >=500 mg/dL — AB
Ketones, ur: NEGATIVE mg/dL
Leukocytes,Ua: NEGATIVE
Nitrite: NEGATIVE
Protein, ur: NEGATIVE mg/dL
Specific Gravity, Urine: 1.02 (ref 1.005–1.030)
pH: 6 (ref 5.0–8.0)

## 2020-09-07 LAB — CBC WITH DIFFERENTIAL/PLATELET
Abs Immature Granulocytes: 0.03 10*3/uL (ref 0.00–0.07)
Basophils Absolute: 0.1 10*3/uL (ref 0.0–0.1)
Basophils Relative: 1 %
Eosinophils Absolute: 0.1 10*3/uL (ref 0.0–0.5)
Eosinophils Relative: 2 %
HCT: 47.5 % (ref 39.0–52.0)
Hemoglobin: 16.8 g/dL (ref 13.0–17.0)
Immature Granulocytes: 0 %
Lymphocytes Relative: 52 %
Lymphs Abs: 4.7 10*3/uL — ABNORMAL HIGH (ref 0.7–4.0)
MCH: 31.5 pg (ref 26.0–34.0)
MCHC: 35.4 g/dL (ref 30.0–36.0)
MCV: 89.1 fL (ref 80.0–100.0)
Monocytes Absolute: 0.7 10*3/uL (ref 0.1–1.0)
Monocytes Relative: 8 %
Neutro Abs: 3.4 10*3/uL (ref 1.7–7.7)
Neutrophils Relative %: 37 %
Platelets: 234 10*3/uL (ref 150–400)
RBC: 5.33 MIL/uL (ref 4.22–5.81)
RDW: 12.1 % (ref 11.5–15.5)
WBC: 9 10*3/uL (ref 4.0–10.5)
nRBC: 0 % (ref 0.0–0.2)

## 2020-09-07 LAB — URINALYSIS, MICROSCOPIC (REFLEX)

## 2020-09-07 MED ORDER — HYDROCODONE-ACETAMINOPHEN 5-325 MG PO TABS
2.0000 | ORAL_TABLET | ORAL | 0 refills | Status: DC | PRN
Start: 2020-09-07 — End: 2021-05-21

## 2020-09-07 MED ORDER — KETOROLAC TROMETHAMINE 15 MG/ML IJ SOLN
15.0000 mg | Freq: Once | INTRAMUSCULAR | Status: AC
  Administered 2020-09-07: 15 mg via INTRAVENOUS
  Filled 2020-09-07: qty 1

## 2020-09-07 MED ORDER — ONDANSETRON HCL 4 MG/2ML IJ SOLN: 4.0000 mg | Freq: Once | INTRAMUSCULAR | Status: AC

## 2020-09-07 MED ORDER — FENTANYL CITRATE (PF) 100 MCG/2ML IJ SOLN: 100.0000 ug | Freq: Once | INTRAMUSCULAR | Status: AC

## 2020-09-07 MED ORDER — ONDANSETRON HCL 4 MG/2ML IJ SOLN
INTRAMUSCULAR | Status: AC
  Administered 2020-09-07: 4 mg via INTRAVENOUS
  Filled 2020-09-07: qty 2

## 2020-09-07 MED ORDER — ONDANSETRON HCL 4 MG/2ML IJ SOLN: 4.0000 mg | Freq: Once | INTRAMUSCULAR | Status: DC

## 2020-09-07 MED ORDER — FENTANYL CITRATE (PF) 100 MCG/2ML IJ SOLN
INTRAMUSCULAR | Status: AC
  Administered 2020-09-07: 100 ug via INTRAVENOUS
  Filled 2020-09-07: qty 2

## 2020-09-07 MED ORDER — SODIUM CHLORIDE 0.9 % IV BOLUS
1000.0000 mL | Freq: Once | INTRAVENOUS | Status: AC
  Administered 2020-09-07: 1000 mL via INTRAVENOUS

## 2020-09-07 MED ORDER — ONDANSETRON 4 MG PO TBDP: ORAL_TABLET | ORAL | 0 refills | Status: DC

## 2020-09-07 NOTE — ED Notes (Signed)
Patient transported to CT 

## 2020-09-07 NOTE — ED Provider Notes (Addendum)
[facility] EMERGENCY DEPARTMENT Provider Note   CSN: [id number] Arrival date & time: 09/07/20  1040     History Chief Complaint  Patient presents with  . Flank Pain    David Horton is a 44 y.o. male.  Patient with history of diabetes, reflux, kidney stone that did not require procedure presents with acute onset severe right flank pain and nausea and vomiting since 10:00.  Pain is severe and intermittent.  No fevers.        Past Medical History:  Diagnosis Date  . Chicken pox   . Diabetes mellitus without complication (HCC)   . GERD (gastroesophageal reflux disease)     Patient Active Problem List   Diagnosis Date Noted  . Acute back pain 03/09/2019  . Type 2 diabetes mellitus (HCC) 07/28/2018  . Preventative health care 04/27/2018  . Essential hypertension 04/27/2018  . GERD 10/19/2008    Past Surgical History:  Procedure Laterality Date  . SHOULDER SURGERY Left 2000, 2011   Left torn Labrum       Family History  Problem Relation Age of Onset  . Diabetes Mellitus Horton Mother   . COPD Father   . Diabetes Mellitus Horton Father   . Diabetes Mellitus I Sister   . Diabetes Mellitus Horton Maternal Grandmother   . Alzheimer's disease Maternal Grandmother     Social History   Tobacco Use  . Smoking status: Never Smoker  . Smokeless tobacco: Never Used  Substance Use Topics  . Alcohol use: Yes    Comment: 2-4 drinks weekly  . Drug use: No    Home Medications Prior to Admission medications   Medication Sig Start Date End Date Taking? Authorizing Provider  metFORMIN (GLUCOPHAGE) 500 MG tablet Take 1 tablet (500 mg total) by mouth 2 (two) times daily with a meal. For diabetes. 05/11/20   Doreene Nest, NP  Multiple Vitamins-Minerals (MULTIVITAMIN ADULT PO) Take by mouth.    [provider]    Allergies    Patient has no known allergies.  Review of Systems   Review of Systems  Constitutional: Negative for chills and fever.    HENT: Negative for congestion.   Eyes: Negative for visual disturbance.  Respiratory: Negative for shortness of breath.   Cardiovascular: Negative for chest pain.  Gastrointestinal: Positive for nausea and vomiting. Negative for abdominal pain.  Genitourinary: Positive for flank pain. Negative for dysuria.  Musculoskeletal: Negative for back pain, neck pain and neck stiffness.  Skin: Negative for rash.  Neurological: Negative for light-headedness and headaches.    Physical Exam Updated Vital Signs BP (!) 201/123 (BP Location: Right Arm)   Pulse 75   Temp 98.6 F (37 C) (Oral)   Resp (!) 28   Ht 6\' 2"  (1.88 m)   Wt 124.7 kg   SpO2 100%   BMI 35.31 kg/m   Physical Exam Vitals and nursing note reviewed.  Constitutional:      General: He is in acute distress.     Appearance: He is well-developed.  HENT:     Head: Normocephalic and atraumatic.     Mouth/Throat:     Mouth: Mucous membranes are dry.  Eyes:     General:        Right eye: No discharge.        Left eye: No discharge.     Conjunctiva/sclera: Conjunctivae normal.  Neck:     Trachea: No tracheal deviation.  Cardiovascular:     Rate and  Rhythm: Normal rate.  Pulmonary:     Effort: Pulmonary effort is normal.  Abdominal:     General: There is no distension.     Palpations: Abdomen is soft.     Tenderness: There is no abdominal tenderness. There is no guarding.  Musculoskeletal:     Cervical back: Normal range of motion and neck supple.  Skin:    General: Skin is warm.     Findings: No rash.  Neurological:     Mental Status: He is alert and oriented to person, place, and time.     ED Results / Procedures / Treatments   Labs (all labs ordered are listed, but only abnormal results are displayed) Labs Reviewed  COMPREHENSIVE METABOLIC PANEL - Abnormal; Notable for the following components:      Result Value   CO2 21 (*)    Glucose, Bld 245 (*)    BUN 23 (*)    ALT 72 (*)    Total Bilirubin 1.7 (*)     All other components within normal limits  CBC WITH DIFFERENTIAL/PLATELET - Abnormal; Notable for the following components:   Lymphs Abs 4.7 (*)    All other components within normal limits  URINALYSIS, ROUTINE W REFLEX MICROSCOPIC - Abnormal; Notable for the following components:   Glucose, UA >=500 (*)    Hgb urine dipstick SMALL (*)    All other components within normal limits  URINALYSIS, MICROSCOPIC (REFLEX) - Abnormal; Notable for the following components:   Bacteria, UA RARE (*)    All other components within normal limits  URINE CULTURE    EKG None  Radiology CT Renal Stone Study  Result Date: 09/07/2020 CLINICAL DATA:  Right flank pain EXAM: CT ABDOMEN AND PELVIS WITHOUT CONTRAST TECHNIQUE: Multidetector CT imaging of the abdomen and pelvis was performed following the standard protocol without IV contrast. COMPARISON:  None. FINDINGS: Lower chest: Lung bases are clear. No effusions. Heart is normal size. Hepatobiliary: No focal hepatic abnormality. Gallbladder unremarkable. Pancreas: No focal abnormality or ductal dilatation. Spleen: No focal abnormality.  Normal size. Adrenals/Urinary Tract: 2 mm calcification at the right UVJ with mild fullness of the right renal collecting system and ureter. No renal or adrenal mass. Urinary bladder unremarkable. Stomach/Bowel: Normal appendix. Stomach, large and small bowel grossly unremarkable. Vascular/Lymphatic: No evidence of aneurysm or adenopathy. Reproductive: No visible focal abnormality. Other: No free fluid or free air. Bilateral inguinal hernias containing fat. Musculoskeletal: No acute bony abnormality. IMPRESSION: 2 mm right UVJ stone with mild fullness of the right renal collecting system and ureter. Electronically Signed   By: Charlett Nose M.D.   On: 09/07/2020 11:37    Procedures Procedures (including critical care time)  Medications Ordered in ED Medications  fentaNYL (SUBLIMAZE) injection 100 mcg (100 mcg Intravenous  Given 09/07/20 1057)  sodium chloride 0.9 % bolus 1,000 mL ( Intravenous Stopped 09/07/20 1215)  ondansetron (ZOFRAN) injection 4 mg (4 mg Intravenous Given 09/07/20 1057)  ketorolac (TORADOL) 15 MG/ML injection 15 mg (15 mg Intravenous Given 09/07/20 1106)    ED Course  I have reviewed the triage vital signs and the nursing notes.  Pertinent labs & imaging results that were available during my care of the patient were reviewed by me and considered in my medical decision making (see chart for details).    MDM Rules/Calculators/A&P                          Patient presents very  uncomfortable, severe pain and actively vomiting in the room.  Concern clinically for significant kidney stone, other differentials include gallbladder, urine infection, musculoskeletal, other.  Plan for blood work, urinalysis and CT stone study.  IV fluids, pain meds and nausea meds ordered. Pt has known diabetes.  Glucose 245, LFTs 72, normal white blood cell count, normal hemoglobin. Urinalysis showed blood and glucose no sign of infection. Patient's pain resolved and tolerating oral liquids on reassessment. CT scan showed 2 mm kidney stone. Discussed follow-up and reasons to return.  Vitals improved, initially elevated due to pain.  Final Clinical Impression(s) / ED Diagnoses Final diagnoses:  Acute right flank pain  Right kidney stone    Rx / DC Orders ED Discharge Orders    None       Blane Ohara, MD 09/07/20 1324    Blane Ohara, MD 09/07/20 1334

## 2020-09-07 NOTE — ED Triage Notes (Signed)
Pt arrives with c/o right flank pain starting around 10 am today with some NV.

## 2020-09-07 NOTE — ED Notes (Signed)
Pt discharged to home. Discharge instructions have been discussed with patient and/or family members. Pt verbally acknowledges understanding d/c instructions, and endorses comprehension to checkout at registration before leaving.  °

## 2020-09-07 NOTE — ED Notes (Signed)
Pt aware urine collection, need, unable to provide urine collection at this time.

## 2020-09-07 NOTE — ED Notes (Signed)
Pt unable to provide urine collection at this time.

## 2020-09-07 NOTE — ED Notes (Signed)
ED Provider at bedside. 

## 2020-09-07 NOTE — Discharge Instructions (Addendum)
Use Zofran as needed for nausea and vomiting. Use Tylenol and ibuprofen every 6 hours for pain. Return for fevers, uncontrolled pain, persistent vomiting or new concerns.

## 2020-09-08 LAB — URINE CULTURE: Culture: NO GROWTH

## 2020-10-21 ENCOUNTER — Other Ambulatory Visit: Payer: Self-pay | Admitting: Primary Care

## 2020-10-21 DIAGNOSIS — E119 Type 2 diabetes mellitus without complications: Secondary | ICD-10-CM

## 2020-11-02 ENCOUNTER — Other Ambulatory Visit: Payer: 59

## 2020-11-02 ENCOUNTER — Other Ambulatory Visit: Payer: Self-pay

## 2020-11-02 ENCOUNTER — Other Ambulatory Visit (INDEPENDENT_AMBULATORY_CARE_PROVIDER_SITE_OTHER): Payer: 59

## 2020-11-02 ENCOUNTER — Other Ambulatory Visit: Payer: Self-pay | Admitting: Primary Care

## 2020-11-02 DIAGNOSIS — E119 Type 2 diabetes mellitus without complications: Secondary | ICD-10-CM | POA: Diagnosis not present

## 2020-11-02 NOTE — Addendum Note (Signed)
Addended by: Alvina Chou on: 11/02/2020 03:34 PM   Modules accepted: Orders

## 2020-11-03 LAB — HEMOGLOBIN A1C
Hgb A1c MFr Bld: 7.9 % of total Hgb — ABNORMAL HIGH (ref <5.7)
Mean Plasma Glucose: 180 (calc)
eAG (mmol/L): 10 (calc)

## 2020-11-03 LAB — LIPID PANEL
Cholesterol: 167 mg/dL (ref <200)
HDL: 30 mg/dL — ABNORMAL LOW (ref >=40)
LDL Cholesterol (Calc): 96 mg/dL (calc)
Non-HDL Cholesterol (Calc): 137 mg/dL (calc) — ABNORMAL HIGH (ref <130)
Total CHOL/HDL Ratio: 5.6 (calc) — ABNORMAL HIGH (ref <5.0)
Triglycerides: 308 mg/dL — ABNORMAL HIGH (ref <150)

## 2020-11-04 DIAGNOSIS — E119 Type 2 diabetes mellitus without complications: Secondary | ICD-10-CM

## 2020-11-05 MED ORDER — METFORMIN HCL 1000 MG PO TABS: 1000.0000 mg | ORAL_TABLET | Freq: Two times a day (BID) | ORAL | 1 refills | Status: DC

## 2021-05-05 ENCOUNTER — Other Ambulatory Visit: Payer: Self-pay | Admitting: Primary Care

## 2021-05-05 DIAGNOSIS — E119 Type 2 diabetes mellitus without complications: Secondary | ICD-10-CM

## 2021-05-07 NOTE — Telephone Encounter (Signed)
He really needs to come in for diabetes follow up, please schedule.  Once scheduled then okay to send refill as pended.

## 2021-05-21 ENCOUNTER — Ambulatory Visit: Payer: 59 | Admitting: Primary Care

## 2021-05-21 ENCOUNTER — Encounter: Payer: Self-pay | Admitting: Primary Care

## 2021-05-21 ENCOUNTER — Other Ambulatory Visit: Payer: Self-pay

## 2021-05-21 VITALS — BP 132/88 | HR 82 | Temp 97.6°F | Ht 74.0 in | Wt 257.0 lb

## 2021-05-21 DIAGNOSIS — E119 Type 2 diabetes mellitus without complications: Secondary | ICD-10-CM

## 2021-05-21 DIAGNOSIS — I10 Essential (primary) hypertension: Secondary | ICD-10-CM | POA: Diagnosis not present

## 2021-05-21 LAB — COMPREHENSIVE METABOLIC PANEL
ALT: 49 U/L (ref 0–53)
AST: 30 U/L (ref 0–37)
Albumin: 4.7 g/dL (ref 3.5–5.2)
Alkaline Phosphatase: 46 U/L (ref 39–117)
BUN: 17 mg/dL (ref 6–23)
CO2: 27 mEq/L (ref 19–32)
Calcium: 9.6 mg/dL (ref 8.4–10.5)
Chloride: 100 mEq/L (ref 96–112)
Creatinine, Ser: 0.8 mg/dL (ref 0.40–1.50)
GFR: 109.05 mL/min (ref >60.00)
Glucose, Bld: 141 mg/dL — ABNORMAL HIGH (ref 70–99)
Potassium: 3.8 mEq/L (ref 3.5–5.1)
Sodium: 137 mEq/L (ref 135–145)
Total Bilirubin: 1.8 mg/dL — ABNORMAL HIGH (ref 0.2–1.2)
Total Protein: 7.5 g/dL (ref 6.0–8.3)

## 2021-05-21 LAB — POCT GLYCOSYLATED HEMOGLOBIN (HGB A1C): Hemoglobin A1C: 6.8 % — AB (ref 4.0–5.6)

## 2021-05-21 LAB — LIPID PANEL
Cholesterol: 158 mg/dL (ref 0–200)
HDL: 38.1 mg/dL — ABNORMAL LOW (ref >39.00)
LDL Cholesterol: 80 mg/dL (ref 0–99)
NonHDL: 119.62
Total CHOL/HDL Ratio: 4
Triglycerides: 198 mg/dL — ABNORMAL HIGH (ref 0.0–149.0)
VLDL: 39.6 mg/dL (ref 0.0–40.0)

## 2021-05-21 LAB — MICROALBUMIN / CREATININE URINE RATIO
Creatinine,U: 144.8 mg/dL
Microalb Creat Ratio: 0.8 mg/g (ref 0.0–30.0)
Microalb, Ur: 1.1 mg/dL (ref 0.0–1.9)

## 2021-05-21 MED ORDER — METFORMIN HCL ER 500 MG PO TB24: 500.0000 mg | ORAL_TABLET | Freq: Every day | ORAL | 3 refills | Status: DC

## 2021-05-21 NOTE — Assessment & Plan Note (Signed)
A1C of 6.8 today which is an improvement.   Will change Metformin to XR version. He would like to reduce back down to 500 mg as he'd like to continue to work on lifestyle changes  Rx for Metformin XR 500 mg sent to pharmacy. Foot exam today. Declines pneumonia vaccine. He will schedule eye exam. Urine micro pending.  Follow up in 6 months.

## 2021-05-21 NOTE — Assessment & Plan Note (Signed)
Improved, continue without medications!

## 2021-05-21 NOTE — Patient Instructions (Signed)
Stop by the lab prior to leaving today. I will notify you of your results once received.   We changed your metformin to the XR version, take 1 tablet by mouth once daily in the morning with breakfast.   Continue to work on your diet.  Please schedule a follow up appointment in 6 months.   It was a pleasure to see you today!

## 2021-05-21 NOTE — Progress Notes (Signed)
Subjective:    Patient ID: David Horton, male    DOB: 12-09-1976, 45 y.o.   MRN: [id number]  HPI  David Horton is a very pleasant 45 y.o. male with a history of type 2 diabetes, hypertension, GERD  who presents today for follow up of diabetes and labs.  1) Type 2 Diabetes:  Current medications include: metformin 1000 mg BID. He has noticed significant GI upset with metformin, especially with a dose increase to 1000 mg BID.   He is checking his blood glucose 0 times daily. He has significantly improved his diet, continues to monitor his weight.   Last A1C: 7.9 in November 2021 Last Eye Exam: Due Last Foot Exam:  Due Pneumonia Vaccination: Declines  ACE/ARB: None, urine micro due Statin: None  BP Readings from Last 3 Encounters:  05/21/21 132/88  09/07/20 (!) 145/90  08/06/20 (!) 144/94      Review of Systems  Eyes: Negative for visual disturbance.  Cardiovascular: Negative for chest pain.  Neurological: Positive for numbness. Negative for dizziness.         Past Medical History:  Diagnosis Date  . Chicken pox   . Diabetes mellitus without complication (HCC)   . GERD (gastroesophageal reflux disease)     Social History   Socioeconomic History  . Marital status: Significant Other    Spouse name: Not on file  . Number of children: Not on file  . Years of education: Not on file  . Highest education level: Not on file  Occupational History  . Not on file  Tobacco Use  . Smoking status: Never Smoker  . Smokeless tobacco: Never Used  Substance and Sexual Activity  . Alcohol use: Yes    Comment: 2-4 drinks weekly  . Drug use: No  . Sexual activity: Not on file  Other Topics Concern  . Not on file  Social History Narrative   Divorced.   2 children.   Works for [employer].   Enjoys working out.    Social Determinants of Health   Financial Resource Strain: Not on file  Food Insecurity: Not on file  Transportation  Needs: Not on file  Physical Activity: Not on file  Stress: Not on file  Social Connections: Not on file  Intimate Partner Violence: Not on file    Past Surgical History:  Procedure Laterality Date  . SHOULDER SURGERY Left 2000, 2011   Left torn Labrum    Family History  Problem Relation Age of Onset  . Diabetes Mellitus Horton Mother   . COPD Father   . Diabetes Mellitus Horton Father   . Diabetes Mellitus I Sister   . Diabetes Mellitus Horton Maternal Grandmother   . Alzheimer's disease Maternal Grandmother     No Known Allergies  Current Outpatient Medications on File Prior to Visit  Medication Sig Dispense Refill  . metFORMIN (GLUCOPHAGE) 1000 MG tablet TAKE 1 TABLET (1,000 MG TOTAL) BY MOUTH 2 (TWO) TIMES DAILY WITH A MEAL. FOR DIABETES. 180 tablet 0  . Multiple Vitamins-Minerals (MULTIVITAMIN ADULT PO) Take by mouth.     No current facility-administered medications on file prior to visit.    BP 132/88   Pulse 82   Temp 97.6 F (36.4 C) (Temporal)   Ht 6\' 2"  (1.88 m)   Wt 257 lb (116.6 kg)   SpO2 96%   BMI 33.00 kg/m  Objective:   Physical Exam Cardiovascular:     Rate and Rhythm: Normal  rate and regular rhythm.  Pulmonary:     Effort: Pulmonary effort is normal.     Breath sounds: Normal breath sounds. No wheezing or rales.  Musculoskeletal:     Cervical back: Neck supple.  Skin:    General: Skin is warm and dry.  Neurological:     Mental Status: He is alert and oriented to person, place, and time.           Assessment & Plan:      This visit occurred during the SARS-CoV-2 public health emergency.  Safety protocols were in place, including screening questions prior to the visit, additional usage of staff PPE, and extensive cleaning of exam room while observing appropriate contact time as indicated for disinfecting solutions.

## 2021-09-06 DIAGNOSIS — E119 Type 2 diabetes mellitus without complications: Secondary | ICD-10-CM

## 2021-09-10 MED ORDER — METFORMIN HCL ER 500 MG PO TB24: 500.0000 mg | ORAL_TABLET | Freq: Two times a day (BID) | ORAL | 0 refills | Status: DC

## 2021-11-21 ENCOUNTER — Ambulatory Visit: Payer: 59 | Admitting: Primary Care

## 2021-12-29 ENCOUNTER — Other Ambulatory Visit: Payer: Self-pay | Admitting: Primary Care

## 2021-12-29 DIAGNOSIS — E119 Type 2 diabetes mellitus without complications: Secondary | ICD-10-CM

## 2021-12-31 ENCOUNTER — Encounter: Payer: Self-pay | Admitting: Primary Care

## 2021-12-31 ENCOUNTER — Ambulatory Visit: Payer: 59 | Admitting: Primary Care

## 2021-12-31 ENCOUNTER — Other Ambulatory Visit: Payer: Self-pay

## 2021-12-31 VITALS — BP 130/76 | HR 71 | Temp 98.0°F | Ht 74.0 in | Wt 257.0 lb

## 2021-12-31 DIAGNOSIS — I10 Essential (primary) hypertension: Secondary | ICD-10-CM | POA: Diagnosis not present

## 2021-12-31 DIAGNOSIS — L918 Other hypertrophic disorders of the skin: Secondary | ICD-10-CM

## 2021-12-31 DIAGNOSIS — Z23 Encounter for immunization: Secondary | ICD-10-CM

## 2021-12-31 DIAGNOSIS — E119 Type 2 diabetes mellitus without complications: Secondary | ICD-10-CM

## 2021-12-31 LAB — POCT GLYCOSYLATED HEMOGLOBIN (HGB A1C): Hemoglobin A1C: 8.6 % — AB (ref 4.0–5.6)

## 2021-12-31 NOTE — Patient Instructions (Signed)
It is important that you improve your diet. Please limit carbohydrates in the form of white bread, rice, pasta, sweets, fast food, fried food, sugary drinks, etc. Increase your consumption of fresh fruits and vegetables, whole grains, lean protein.  Ensure you are consuming 64 ounces of water daily.  Start exercising. You should be getting 150 minutes of moderate intensity exercise weekly.  It was a pleasure to see you today!

## 2021-12-31 NOTE — Assessment & Plan Note (Signed)
Controlled in the office today, continue without medications.

## 2021-12-31 NOTE — Progress Notes (Signed)
Subjective:    Patient ID: David Horton, male    DOB: 06/02/1976, 46 y.o.   MRN: [id number]  HPI  David Horton is a very pleasant 46 y.o. male with a history of type 2 diabetes, GERD, hypertension who presents today for follow up of diabetes and hypertension. He would also like a skin tag removed.  1) Type 2 Diabetes:  Current medications include: Metformin XR 500 mg BID  He is checking his blood glucose on occasion times daily and is getting readings of 200's.   Last A1C: 6.8 in June 2022, 8.6 today.  Last Eye Exam: Due, he will schedule  Last Foot Exam: UTD Pneumonia Vaccination: Never completed Urine Microalbumin: UTD Statin: None. Patient declines  Dietary changes since last visit: Unhealthy diet. Fast food, take out food. He is motivated     Exercise: No regular exercise   2) Essential Hypertension: Not currently managed on mediation. Denies chest pain, dizziness, headaches.  BP Readings from Last 3 Encounters:  12/31/21 130/76  05/21/21 132/88  09/07/20 (!) 145/90   3) Skin Tag: Chronic to right lateral mid thigh for years. Bothersome as it snags on clothing. Increased size over the last month, no change in color. He would like this removed if possible.    Review of Systems  Eyes:  Negative for visual disturbance.  Respiratory:  Negative for shortness of breath.   Cardiovascular:  Negative for chest pain.  Neurological:  Positive for numbness. Negative for dizziness.        Past Medical History:  Diagnosis Date   Chicken pox    Diabetes mellitus without complication (HCC)    GERD (gastroesophageal reflux disease)     Social History   Socioeconomic History   Marital status: Significant Other    Spouse name: Not on file   Number of children: Not on file   Years of education: Not on file   Highest education level: Not on file  Occupational History   Not on file  Tobacco Use   Smoking status: Never   Smokeless tobacco: Never   Substance and Sexual Activity   Alcohol use: Yes    Comment: 2-4 drinks weekly   Drug use: No   Sexual activity: Not on file  Other Topics Concern   Not on file  Social History Narrative   Divorced.   2 children.   Works for [employer].   Enjoys working out.    Social Determinants of Health   Financial Resource Strain: Not on file  Food Insecurity: Not on file  Transportation Needs: Not on file  Physical Activity: Not on file  Stress: Not on file  Social Connections: Not on file  Intimate Partner Violence: Not on file    Past Surgical History:  Procedure Laterality Date   SHOULDER SURGERY Left 2000, 2011   Left torn Labrum    Family History  Problem Relation Age of Onset   Diabetes Mellitus Horton Mother    COPD Father    Diabetes Mellitus Horton Father    Diabetes Mellitus I Sister    Diabetes Mellitus Horton Maternal Grandmother    Alzheimer's disease Maternal Grandmother     No Known Allergies  Current Outpatient Medications on File Prior to Visit  Medication Sig Dispense Refill   metFORMIN (GLUCOPHAGE XR) 500 MG 24 hr tablet Take 1 tablet (500 mg total) by mouth 2 (two) times daily with a meal. For diabetes. 180 tablet 0  No current facility-administered medications on file prior to visit.    BP 130/76    Pulse 71    Temp 98 F (36.7 C) (Temporal)    Ht 6\' 2"  (1.88 m)    Wt 257 lb (116.6 kg)    SpO2 99%    BMI 33.00 kg/m  Objective:   Physical Exam Cardiovascular:     Rate and Rhythm: Normal rate and regular rhythm.  Pulmonary:     Effort: Pulmonary effort is normal.     Breath sounds: Normal breath sounds. No wheezing or rales.  Musculoskeletal:     Cervical back: Neck supple.  Skin:    General: Skin is warm and dry.     Comments: 2-3 mm flesh colored skin tag to right lateral mid to upper thigh. No erythema or discoloration.   Neurological:     Mental Status: He is alert and oriented to person, place, and time.           Assessment & Plan:      This visit occurred during the SARS-CoV-2 public health emergency.  Safety protocols were in place, including screening questions prior to the visit, additional usage of staff PPE, and extensive cleaning of exam room while observing appropriate contact time as indicated for disinfecting solutions.

## 2021-12-31 NOTE — Assessment & Plan Note (Signed)
Uncontrolled with increase of A1C to 8.6 today.  He is motivated today to work on improving his diet and exercise to get his A1C down.  He declines a dose increase of his metformin to XR 1500 mg which was recommended.   Continue Metformin XR 500 mg BID for now. We will plan to see him back in 3 months.

## 2021-12-31 NOTE — Assessment & Plan Note (Signed)
Uncomplicated.  Patient consents to removal. Site cleansed with betadine solution. Pain Ease utilized for analgesia. 1 skin tag removed without complication, 11 blade utilized.  Silver nitrate stick used for bleeding which was minimal.  Dressing applied.   Home care instructions provided.

## 2022-04-03 ENCOUNTER — Telehealth: Payer: Self-pay

## 2022-04-03 NOTE — Telephone Encounter (Signed)
Left v/m requesting pt to cb to reschedule his 04/04/22 appt. Appt cancelled as requested. Sending note to [facility] support. ?

## 2022-04-03 NOTE — Telephone Encounter (Signed)
[facility] Night - Client ?Nonclinical Telephone Record  ?AccessNurse? ?Client [facility] Night - Client ?Client Site [facility] - Night ?Contact Type Call ?Who Is Calling Patient / Member / Family / Caregiver ?Caller Name Brodee Mauritz ?Caller Phone Number [phone] ?Patient Name David Horton ?Patient DOB [birth year] ?Call Type Message Only Information Provided ?Reason for Call Request for General Office Information ?Initial Comment Caller states he has an appt for 5/21 and is needing to reschedule. ?Disp. Time Disposition Final User ?04/02/2022 5:35:40 PM General Information Provided Yes Elita Boone ?Call Closed By: Elita Boone ?Transaction Date/Time: 04/02/2022 5:33:24 PM (ET ?

## 2022-04-04 ENCOUNTER — Ambulatory Visit: Payer: 59 | Admitting: Primary Care

## 2022-06-23 LAB — HM DIABETES EYE EXAM

## 2022-06-30 ENCOUNTER — Other Ambulatory Visit: Payer: Self-pay | Admitting: Primary Care

## 2022-06-30 DIAGNOSIS — E119 Type 2 diabetes mellitus without complications: Secondary | ICD-10-CM

## 2022-07-29 ENCOUNTER — Telehealth: Payer: Self-pay | Admitting: Primary Care

## 2022-07-29 DIAGNOSIS — E119 Type 2 diabetes mellitus without complications: Secondary | ICD-10-CM

## 2022-07-29 NOTE — Telephone Encounter (Signed)
Patient has been scheduled

## 2022-07-29 NOTE — Telephone Encounter (Signed)
Office visit required ASAP for further refills.

## 2022-07-29 NOTE — Telephone Encounter (Signed)
Left message to return call to our office.  

## 2022-08-05 ENCOUNTER — Ambulatory Visit: Payer: 59 | Admitting: Primary Care

## 2022-08-05 ENCOUNTER — Encounter: Payer: Self-pay | Admitting: Primary Care

## 2022-08-05 VITALS — BP 138/82 | HR 77 | Temp 98.6°F | Ht 74.0 in | Wt 251.0 lb

## 2022-08-05 DIAGNOSIS — E1165 Type 2 diabetes mellitus with hyperglycemia: Secondary | ICD-10-CM | POA: Diagnosis not present

## 2022-08-05 DIAGNOSIS — I10 Essential (primary) hypertension: Secondary | ICD-10-CM | POA: Diagnosis not present

## 2022-08-05 DIAGNOSIS — Z23 Encounter for immunization: Secondary | ICD-10-CM

## 2022-08-05 LAB — CBC
HCT: 46.8 % (ref 39.0–52.0)
Hemoglobin: 16.1 g/dL (ref 13.0–17.0)
MCHC: 34.4 g/dL (ref 30.0–36.0)
MCV: 90.2 fl (ref 78.0–100.0)
Platelets: 204 10*3/uL (ref 150.0–400.0)
RBC: 5.19 Mil/uL (ref 4.22–5.81)
RDW: 12.9 % (ref 11.5–15.5)
WBC: 6 10*3/uL (ref 4.0–10.5)

## 2022-08-05 LAB — LIPID PANEL
Cholesterol: 170 mg/dL (ref 0–200)
HDL: 39.8 mg/dL (ref >39.00)
LDL Cholesterol: 94 mg/dL (ref 0–99)
NonHDL: 130.32
Total CHOL/HDL Ratio: 4
Triglycerides: 181 mg/dL — ABNORMAL HIGH (ref 0.0–149.0)
VLDL: 36.2 mg/dL (ref 0.0–40.0)

## 2022-08-05 LAB — COMPREHENSIVE METABOLIC PANEL
ALT: 35 U/L (ref 0–53)
AST: 20 U/L (ref 0–37)
Albumin: 4.5 g/dL (ref 3.5–5.2)
Alkaline Phosphatase: 48 U/L (ref 39–117)
BUN: 16 mg/dL (ref 6–23)
CO2: 27 mEq/L (ref 19–32)
Calcium: 9.1 mg/dL (ref 8.4–10.5)
Chloride: 103 mEq/L (ref 96–112)
Creatinine, Ser: 0.72 mg/dL (ref 0.40–1.50)
GFR: 111.62 mL/min (ref >60.00)
Glucose, Bld: 260 mg/dL — ABNORMAL HIGH (ref 70–99)
Potassium: 4.4 mEq/L (ref 3.5–5.1)
Sodium: 138 mEq/L (ref 135–145)
Total Bilirubin: 2.1 mg/dL — ABNORMAL HIGH (ref 0.2–1.2)
Total Protein: 7.1 g/dL (ref 6.0–8.3)

## 2022-08-05 NOTE — Progress Notes (Signed)
Subjective:    Patient ID: David Horton, male    DOB: [birth year], 46 y.o.   MRN: [id number]  Diabetes Pertinent negatives for hypoglycemia include no dizziness. Pertinent negatives for diabetes include no chest pain.    David Horton is a very pleasant 46 y.o. male with a history of type 2 diabetes, hypertension, GERD who presents today for follow up of chronic conditions.   1) Type 2 Diabetes:   Current medications include: Metformin XR 500 mg BID  He is checking his blood glucose 0 times daily.  Last A1C: 8.6 in January 2023 Last Eye Exam: Due Last Foot Exam: Due Pneumonia Vaccination: Never completed  Urine Microalbumin: Due Statin: None.   Dietary changes since last visit: Take out food.    Exercise: No regular exercise.   BP Readings from Last 3 Encounters:  08/05/22 138/82  12/31/21 130/76  05/21/21 132/88      2) Essential Hypertension: Currently not managed on treatment. He has not been checking BP at home. He denies chest pain, headaches, dizziness. He endorses a poor diet and no routine exercise since his last visit.    Review of Systems  Respiratory:  Negative for shortness of breath.   Cardiovascular:  Negative for chest pain.  Gastrointestinal:  Negative for constipation and diarrhea.  Neurological:  Positive for numbness. Negative for dizziness.         Past Medical History:  Diagnosis Date   Chicken pox    Diabetes mellitus without complication (HCC)    GERD (gastroesophageal reflux disease)     Social History   Socioeconomic History   Marital status: Significant Other    Spouse name: Not on file   Number of children: Not on file   Years of education: Not on file   Highest education level: Not on file  Occupational History   Not on file  Tobacco Use   Smoking status: Never   Smokeless tobacco: Never  Substance and Sexual Activity   Alcohol use: Yes    Comment: 2-4 drinks weekly   Drug use: No   Sexual activity:  Not on file  Other Topics Concern   Not on file  Social History Narrative   Divorced.   2 children.   Works for [employer].   Enjoys working out.    Social Determinants of Health   Financial Resource Strain: Not on file  Food Insecurity: Not on file  Transportation Needs: Not on file  Physical Activity: Not on file  Stress: Not on file  Social Connections: Not on file  Intimate Partner Violence: Not on file    Past Surgical History:  Procedure Laterality Date   SHOULDER SURGERY Left 2000, 2011   Left torn Labrum    Family History  Problem Relation Age of Onset   Diabetes Mellitus Horton Mother    COPD Father    Diabetes Mellitus Horton Father    Diabetes Mellitus I Sister    Diabetes Mellitus Horton Maternal Grandmother    Alzheimer's disease Maternal Grandmother     No Known Allergies  Current Outpatient Medications on File Prior to Visit  Medication Sig Dispense Refill   metFORMIN (GLUCOPHAGE-XR) 500 MG 24 hr tablet Take 1 tablet (500 mg total) by mouth 2 (two) times daily with a meal. For diabetes. Office visit required for further refills. 60 tablet 0   No current facility-administered medications on file prior to visit.    BP 138/82   Pulse  77   Temp 98.6 F (37 C) (Oral)   Ht 6\' 2"  (1.88 m)   Wt 251 lb (113.9 kg)   SpO2 97%   BMI 32.23 kg/m  Objective:   Physical Exam Cardiovascular:     Rate and Rhythm: Normal rate and regular rhythm.  Pulmonary:     Effort: Pulmonary effort is normal.     Breath sounds: Normal breath sounds. No wheezing or rales.  Musculoskeletal:     Cervical back: Neck supple.  Skin:    General: Skin is warm and dry.  Neurological:     Mental Status: He is alert and oriented to person, place, and time.           Assessment & Plan:   Problem List Items Addressed This Visit       Cardiovascular and Mediastinum   Essential hypertension    Borderline high today.  Poor diet and has not been exercising,  discussed to work on both. Remain off treatment for now.  Continue to closely monitor.       Relevant Orders   Comprehensive metabolic panel   CBC     Endocrine   Type 2 diabetes mellitus with hyperglycemia (HCC) - Primary    Uncontrolled in January 2023, no follow up since.   Urine microalbumin due and pending. Foot exam today.  Pneumonia vaccine provided today.  Continue metformin XR 500 mg BID. Discussed to work on diet.   Consider increasing metformin vs adding additional treatment if warranted.  Follow up in 3-6 months based on A1C result.       Relevant Orders   Lipid panel   Microalbumin / creatinine urine ratio       04-30-2004, NP

## 2022-08-05 NOTE — Assessment & Plan Note (Signed)
Borderline high today.  Poor diet and has not been exercising, discussed to work on both. Remain off treatment for now.  Continue to closely monitor.

## 2022-08-05 NOTE — Patient Instructions (Addendum)
Stop by the lab prior to leaving today. I will notify you of your results once received.   Resume regular exercise.  Work on [employer].   It was a pleasure to see you today!

## 2022-08-05 NOTE — Assessment & Plan Note (Addendum)
Uncontrolled in January 2023, no follow up since.   Urine microalbumin due and pending. Foot exam today.  Pneumonia vaccine provided today.  Continue metformin XR 500 mg BID. Discussed to work on diet.   Consider increasing metformin vs adding additional treatment if warranted.  Follow up in 3-6 months based on A1C result.

## 2022-08-06 LAB — MICROALBUMIN / CREATININE URINE RATIO
Creatinine,U: 154.2 mg/dL
Microalb Creat Ratio: 0.8 mg/g (ref 0.0–30.0)
Microalb, Ur: 1.3 mg/dL (ref 0.0–1.9)

## 2022-08-06 NOTE — Addendum Note (Signed)
Addended by: Donnamarie Poag on: 08/06/2022 11:09 AM   Modules accepted: Orders

## 2022-08-07 ENCOUNTER — Telehealth: Payer: Self-pay

## 2022-08-07 ENCOUNTER — Other Ambulatory Visit: Payer: Self-pay | Admitting: Primary Care

## 2022-08-07 DIAGNOSIS — E119 Type 2 diabetes mellitus without complications: Secondary | ICD-10-CM

## 2022-08-07 MED ORDER — METFORMIN HCL ER 500 MG PO TB24: 500.0000 mg | ORAL_TABLET | Freq: Two times a day (BID) | ORAL | 1 refills | Status: DC

## 2022-08-07 NOTE — Telephone Encounter (Signed)
-----   Message from Doreene Nest, NP sent at 08/07/2022  1:36 PM EDT ----- Please call patient and have him set up a lab appointment at his convenience to add on a test for his elevated bilirubin.  The lab cannot add on the test to his existing blood work.

## 2022-08-07 NOTE — Telephone Encounter (Signed)
Called lvm for patient to call back to make a lab appt.

## 2022-08-08 ENCOUNTER — Other Ambulatory Visit (INDEPENDENT_AMBULATORY_CARE_PROVIDER_SITE_OTHER): Payer: 59

## 2022-08-09 LAB — BILIRUBIN, FRACTIONATED(TOT/DIR/INDIR)
Bilirubin, Direct: 0.3 mg/dL — ABNORMAL HIGH (ref 0.0–0.2)
Indirect Bilirubin: 1.7 mg/dL (calc) — ABNORMAL HIGH (ref 0.2–1.2)
Total Bilirubin: 2 mg/dL — ABNORMAL HIGH (ref 0.2–1.2)

## 2022-08-27 ENCOUNTER — Encounter: Payer: Self-pay | Admitting: Primary Care

## 2022-10-07 ENCOUNTER — Ambulatory Visit
Admission: RE | Admit: 2022-10-07 | Discharge: 2022-10-07 | Disposition: A | Discharge status: Home / Self Care | Payer: 59 | Source: Ambulatory Visit | Attending: Primary Care | Admitting: Primary Care

## 2023-01-17 ENCOUNTER — Other Ambulatory Visit: Payer: Self-pay | Admitting: Primary Care

## 2023-01-17 DIAGNOSIS — E119 Type 2 diabetes mellitus without complications: Secondary | ICD-10-CM

## 2023-01-17 NOTE — Telephone Encounter (Signed)
Patient is due for diabetes follow up, this will be required prior to any further refills.  Please schedule, thank you!

## 2023-01-19 NOTE — Telephone Encounter (Signed)
Spoke to pt, pt stated he call back to schedule appt

## 2023-02-15 ENCOUNTER — Other Ambulatory Visit: Payer: Self-pay | Admitting: Primary Care

## 2023-02-15 DIAGNOSIS — E119 Type 2 diabetes mellitus without complications: Secondary | ICD-10-CM

## 2023-02-25 ENCOUNTER — Other Ambulatory Visit: Payer: Self-pay | Admitting: Primary Care

## 2023-02-25 DIAGNOSIS — E119 Type 2 diabetes mellitus without complications: Secondary | ICD-10-CM

## 2023-02-25 MED ORDER — METFORMIN HCL ER 500 MG PO TB24: 500.0000 mg | ORAL_TABLET | Freq: Two times a day (BID) | ORAL | 0 refills | Status: DC

## 2023-02-25 NOTE — Telephone Encounter (Signed)
From: Francis Dowse II To: Office of Pleas Koch, NP Sent: 02/24/2023 6:09 PM EDT Subject: Medication Renewal Request  Refills have been requested for the following medications:   metFORMIN (GLUCOPHAGE-XR) 500 MG 24 hr tablet [Tondra Reierson K Trudie Cervantes]  Patient Comment: Only have enough for 7 days and first available appt not until Mar 28, which is scheduled  Preferred pharmacy: [id number]- [place], [place]- [address] Delivery method: PArlyss Gandy

## 2023-03-12 ENCOUNTER — Encounter: Payer: Self-pay | Admitting: Primary Care

## 2023-03-12 ENCOUNTER — Ambulatory Visit: Payer: 59 | Admitting: Primary Care

## 2023-03-12 VITALS — BP 136/82 | HR 82 | Temp 98.2°F | Ht 74.0 in | Wt 247.0 lb

## 2023-03-12 DIAGNOSIS — E1165 Type 2 diabetes mellitus with hyperglycemia: Secondary | ICD-10-CM

## 2023-03-12 LAB — POCT GLYCOSYLATED HEMOGLOBIN (HGB A1C): Hemoglobin A1C: 8.7 % — AB (ref 4.0–5.6)

## 2023-03-12 NOTE — Assessment & Plan Note (Addendum)
Uncontrolled with A1C today of 8.7. Discussed with patient.  Recommended dose increase of Metformin, however, he would like to work on dietary changes. Continue metformin XR 500 mg BID. Discussed to limit candy and processed foods. Continue with regular exercise.  We will follow up in 3 months for repeat A1C.

## 2023-03-12 NOTE — Patient Instructions (Signed)
Limit candy and processed foods.  Continue with regular exercise and water intake.  Please schedule a follow up visit for 3 months.  It was a pleasure to see you today!

## 2023-03-12 NOTE — Progress Notes (Signed)
Subjective:    Patient ID: David Horton, male    DOB: [birth year], 47 y.o.   MRN: [id number]  HPI  David Horton is a very pleasant 48 y.o. male with a history of hypertension, type 2 diabetes, GERD who presents today for follow up of diabetes.  Current medications include: metformin XR 500 mg BID  He is checking his blood glucose 0 times daily.  Last A1C: 8.6 in January 2023, 8.7 today Last Eye Exam: UTD Last Foot Exam: UTD Pneumonia Vaccination: 2023 Urine Microalbumin: UTD Statin: Declines  Dietary changes since last visit: Under eating. Candy daily, chips, processed food. Drinking mostly water.    Exercise: 3-5 times weekly   Wt Readings from Last 3 Encounters:  03/12/23 247 lb (112 kg)  08/05/22 251 lb (113.9 kg)  12/31/21 257 lb (116.6 kg)      Review of Systems  Eyes:  Negative for visual disturbance.  Respiratory:  Negative for shortness of breath.   Cardiovascular:  Negative for chest pain.  Endocrine: Positive for polyuria. Negative for polydipsia.  Neurological:  Negative for dizziness and numbness.         Past Medical History:  Diagnosis Date   Chicken pox    Diabetes mellitus without complication (HCC)    GERD (gastroesophageal reflux disease)     Social History   Socioeconomic History   Marital status: Single    Spouse name: Not on file   Number of children: Not on file   Years of education: Not on file   Highest education level: Bachelor's degree (e.g., BA, AB, BS)  Occupational History   Not on file  Tobacco Use   Smoking status: Never   Smokeless tobacco: Never  Substance and Sexual Activity   Alcohol use: Yes    Comment: 2-4 drinks weekly   Drug use: No   Sexual activity: Not on file  Other Topics Concern   Not on file  Social History Narrative   Divorced.   2 children.   Works for [employer].   Enjoys working out.    Social Determinants of Health   Financial Resource Strain:  Patient Declined (03/11/2023)   Overall Financial Resource Strain (CARDIA)    Difficulty of Paying Living Expenses: Patient declined  Food Insecurity: Patient Declined (03/11/2023)   Hunger Vital Sign    Worried About Running Out of Food in the Last Year: Patient declined    [place] in the Last Year: Patient declined  Transportation Needs: No Transportation Needs (03/11/2023)   PRAPARE - Hydrologist (Medical): No    Lack of Transportation (Non-Medical): No  Physical Activity: Sufficiently Active (03/11/2023)   Exercise Vital Sign    Days of Exercise per Week: 5 days    Minutes of Exercise per Session: 60 min  Stress: No Stress Concern Present (03/11/2023)   [place]    Feeling of Stress : Not at all  Social Connections: Socially Isolated (03/11/2023)   Social Connection and Isolation Panel [NHANES]    Frequency of Communication with Friends and Family: Never    Frequency of Social Gatherings with Friends and Family: Never    Attends Religious Services: Never    Marine scientist or Organizations: No    Attends Music therapist: Not on file    Marital Status: Divorced  Intimate Partner Violence: Not on file  Past Surgical History:  Procedure Laterality Date   SHOULDER SURGERY Left 2000, 2011   Left torn Labrum    Family History  Problem Relation Age of Onset   Diabetes Mellitus Horton Mother    COPD Father    Diabetes Mellitus Horton Father    Diabetes Mellitus I Sister    Diabetes Mellitus Horton Maternal Grandmother    Alzheimer's disease Maternal Grandmother     No Known Allergies  Current Outpatient Medications on File Prior to Visit  Medication Sig Dispense Refill   metFORMIN (GLUCOPHAGE-XR) 500 MG 24 hr tablet Take 1 tablet (500 mg total) by mouth 2 (two) times daily with a meal. For diabetes. 60 tablet 0   No current facility-administered medications on  file prior to visit.    BP 136/82   Pulse 82   Temp 98.2 F (36.8 C) (Temporal)   Ht 6\' 2"  (1.88 m)   Wt 247 lb (112 kg)   SpO2 99%   BMI 31.71 kg/m  Objective:   Physical Exam Cardiovascular:     Rate and Rhythm: Normal rate and regular rhythm.  Pulmonary:     Effort: Pulmonary effort is normal.     Breath sounds: Normal breath sounds. No wheezing or rales.  Musculoskeletal:     Cervical back: Neck supple.  Skin:    General: Skin is warm and dry.  Neurological:     Mental Status: He is alert and oriented to person, place, and time.           Assessment & Plan:  Type 2 diabetes mellitus with hyperglycemia, without long-term current use of insulin (HCC) Assessment & Plan: Uncontrolled with A1C today of 8.7. Discussed with patient.  Recommended dose increase of Metformin, however, he would like to work on dietary changes. Continue metformin XR 500 mg BID. Discussed to limit candy and processed foods. Continue with regular exercise.  We will follow up in 3 months for repeat A1C.   Orders: -     POCT glycosylated hemoglobin (Hb A1C)        Pleas Koch, NP

## 2023-03-23 ENCOUNTER — Other Ambulatory Visit: Payer: Self-pay | Admitting: Primary Care

## 2023-03-23 DIAGNOSIS — E119 Type 2 diabetes mellitus without complications: Secondary | ICD-10-CM

## 2023-06-12 ENCOUNTER — Ambulatory Visit: Payer: 59 | Admitting: Primary Care

## 2023-06-14 ENCOUNTER — Other Ambulatory Visit: Payer: Self-pay | Admitting: Primary Care

## 2023-06-14 DIAGNOSIS — E119 Type 2 diabetes mellitus without complications: Secondary | ICD-10-CM

## 2023-07-14 ENCOUNTER — Encounter: Payer: Self-pay | Admitting: Primary Care

## 2023-07-14 ENCOUNTER — Ambulatory Visit: Payer: 59 | Admitting: Primary Care

## 2023-07-14 VITALS — BP 154/88 | HR 88 | Temp 97.5°F | Ht 74.0 in | Wt 261.0 lb

## 2023-07-14 DIAGNOSIS — E1165 Type 2 diabetes mellitus with hyperglycemia: Secondary | ICD-10-CM

## 2023-07-14 DIAGNOSIS — Z7984 Long term (current) use of oral hypoglycemic drugs: Secondary | ICD-10-CM | POA: Diagnosis not present

## 2023-07-14 DIAGNOSIS — I10 Essential (primary) hypertension: Secondary | ICD-10-CM | POA: Diagnosis not present

## 2023-07-14 DIAGNOSIS — Z0000 Encounter for general adult medical examination without abnormal findings: Secondary | ICD-10-CM | POA: Diagnosis not present

## 2023-07-14 LAB — POCT GLYCOSYLATED HEMOGLOBIN (HGB A1C): Hemoglobin A1C: 8.9 % — AB (ref 4.0–5.6)

## 2023-07-14 LAB — CBC
HCT: 45.3 % (ref 39.0–52.0)
Hemoglobin: 15.4 g/dL (ref 13.0–17.0)
MCHC: 34.1 g/dL (ref 30.0–36.0)
MCV: 90.6 fl (ref 78.0–100.0)
Platelets: 227 10*3/uL (ref 150.0–400.0)
RBC: 4.99 Mil/uL (ref 4.22–5.81)
RDW: 13.1 % (ref 11.5–15.5)
WBC: 6.9 10*3/uL (ref 4.0–10.5)

## 2023-07-14 LAB — COMPREHENSIVE METABOLIC PANEL
ALT: 34 U/L (ref 0–53)
AST: 18 U/L (ref 0–37)
Albumin: 4.4 g/dL (ref 3.5–5.2)
Alkaline Phosphatase: 58 U/L (ref 39–117)
BUN: 15 mg/dL (ref 6–23)
CO2: 26 mEq/L (ref 19–32)
Calcium: 9.8 mg/dL (ref 8.4–10.5)
Chloride: 101 mEq/L (ref 96–112)
Creatinine, Ser: 0.83 mg/dL (ref 0.40–1.50)
GFR: 106.23 mL/min (ref >60.00)
Glucose, Bld: 295 mg/dL — ABNORMAL HIGH (ref 70–99)
Potassium: 4.3 mEq/L (ref 3.5–5.1)
Sodium: 138 mEq/L (ref 135–145)
Total Bilirubin: 1.2 mg/dL (ref 0.2–1.2)
Total Protein: 6.8 g/dL (ref 6.0–8.3)

## 2023-07-14 LAB — LIPID PANEL
Cholesterol: 136 mg/dL (ref 0–200)
HDL: 35.8 mg/dL — ABNORMAL LOW (ref >39.00)
NonHDL: 100.31
Total CHOL/HDL Ratio: 4
Triglycerides: 317 mg/dL — ABNORMAL HIGH (ref 0.0–149.0)
VLDL: 63.4 mg/dL — ABNORMAL HIGH (ref 0.0–40.0)

## 2023-07-14 LAB — MICROALBUMIN / CREATININE URINE RATIO
Creatinine,U: 69.6 mg/dL
Microalb Creat Ratio: 1 mg/g (ref 0.0–30.0)
Microalb, Ur: <0.7 mg/dL (ref 0.0–1.9)

## 2023-07-14 LAB — LDL CHOLESTEROL, DIRECT: Direct LDL: 78 mg/dL

## 2023-07-14 MED ORDER — METFORMIN HCL ER 750 MG PO TB24: 750.0000 mg | ORAL_TABLET | Freq: Two times a day (BID) | ORAL | 1 refills | Status: DC

## 2023-07-14 NOTE — Assessment & Plan Note (Signed)
Above goal in the office, also on recheck. Home readings are better.  He will work on diet and exercise.  Continue to monitor.

## 2023-07-14 NOTE — Progress Notes (Signed)
Subjective:    Patient ID: David Horton, male    DOB: [birth year], 47 y.o.   MRN: [id number]  HPI  David Horton is a very pleasant 47 y.o. male with a history of type 2 diabetes, hypertension who presents today for complete physical and follow up of chronic conditions.  Immunizations: -Tetanus: Completed in 2019 -Pneumonia: Completed in 2023  Diet: Poor diet, gas station food.  Exercise: No regular exercise.   Eye exam: Completed last year.  Dental exam: Completed several years ago.   BP Readings from Last 3 Encounters:  07/14/23 (!) 154/88  03/12/23 136/82  08/05/22 138/82    He is checking BP at home which is running 130-140-80's.   Review of Systems  Constitutional:  Negative for unexpected weight change.  HENT:  Negative for rhinorrhea.   Respiratory:  Negative for cough and shortness of breath.   Cardiovascular:  Negative for chest pain.  Gastrointestinal:  Negative for constipation and diarrhea.  Genitourinary:  Negative for difficulty urinating.  Musculoskeletal:  Negative for arthralgias and myalgias.  Skin:  Negative for rash.  Allergic/Immunologic: Negative for environmental allergies.  Neurological:  Negative for dizziness, numbness and headaches.  Psychiatric/Behavioral:  The patient is not nervous/anxious.          Past Medical History:  Diagnosis Date   Chicken pox    Diabetes mellitus without complication (HCC)    GERD (gastroesophageal reflux disease)     Social History   Socioeconomic History   Marital status: Single    Spouse name: Not on file   Number of children: Not on file   Years of education: Not on file   Highest education level: Bachelor's degree (e.g., BA, AB, BS)  Occupational History   Not on file  Tobacco Use   Smoking status: Never   Smokeless tobacco: Never  Substance and Sexual Activity   Alcohol use: Yes    Comment: 2-4 drinks weekly   Drug use: No   Sexual activity: Not on file  Other Topics Concern    Not on file  Social History Narrative   Divorced.   2 children.   Works for [employer].   Enjoys working out.    Social Determinants of Health   Financial Resource Strain: Patient Declined (03/11/2023)   Overall Financial Resource Strain (CARDIA)    Difficulty of Paying Living Expenses: Patient declined  Food Insecurity: Patient Declined (03/11/2023)   Hunger Vital Sign    Worried About Running Out of Food in the Last Year: Patient declined    Ran Out of Food in the Last Year: Patient declined  Transportation Needs: No Transportation Needs (03/11/2023)   PRAPARE - Administrator, Civil Service (Medical): No    Lack of Transportation (Non-Medical): No  Physical Activity: Sufficiently Active (03/11/2023)   Exercise Vital Sign    Days of Exercise per Week: 5 days    Minutes of Exercise per Session: 60 min  Stress: No Stress Concern Present (03/11/2023)   [employer] of Occupational Health - Occupational Stress Questionnaire    Feeling of Stress : Not at all  Social Connections: Socially Isolated (03/11/2023)   Social Connection and Isolation Panel [NHANES]    Frequency of Communication with Friends and Family: Never    Frequency of Social Gatherings with Friends and Family: Never    Attends Religious Services: Never    Database administrator or Organizations: No    Attends Club or  Organization Meetings: Not on file    Marital Status: Divorced  Intimate Partner Violence: Not on file    Past Surgical History:  Procedure Laterality Date   SHOULDER SURGERY Left 2000, 2011   Left torn Labrum    Family History  Problem Relation Age of Onset   Diabetes Mellitus Horton Mother    COPD Father    Diabetes Mellitus Horton Father    Diabetes Mellitus I Sister    Diabetes Mellitus Horton Maternal Grandmother    Alzheimer's disease Maternal Grandmother     No Known Allergies  No current outpatient medications on file prior to visit.   No current  facility-administered medications on file prior to visit.    BP (!) 154/88   Pulse 88   Temp (!) 97.5 F (36.4 C) (Temporal)   Ht 6\' 2"  (1.88 m)   Wt 261 lb (118.4 kg)   SpO2 98%   BMI 33.51 kg/m  Objective:   Physical Exam HENT:     Right Ear: Tympanic membrane and ear canal normal.     Left Ear: Tympanic membrane and ear canal normal.     Nose: Nose normal.     Right Sinus: No maxillary sinus tenderness or frontal sinus tenderness.     Left Sinus: No maxillary sinus tenderness or frontal sinus tenderness.  Eyes:     Conjunctiva/sclera: Conjunctivae normal.  Neck:     Thyroid: No thyromegaly.     Vascular: No carotid bruit.  Cardiovascular:     Rate and Rhythm: Normal rate and regular rhythm.     Heart sounds: Normal heart sounds.  Pulmonary:     Effort: Pulmonary effort is normal.     Breath sounds: Normal breath sounds. No wheezing or rales.  Abdominal:     General: Bowel sounds are normal.     Palpations: Abdomen is soft.     Tenderness: There is no abdominal tenderness.  Musculoskeletal:        General: Normal range of motion.     Cervical back: Neck supple.  Skin:    General: Skin is warm and dry.  Neurological:     Mental Status: He is alert and oriented to person, place, and time.     Cranial Nerves: No cranial nerve deficit.     Deep Tendon Reflexes: Reflexes are normal and symmetric.  Psychiatric:        Mood and Affect: Mood normal.           Assessment & Plan:  Preventative health care Assessment & Plan: Immunizations UTD.  Discussed the importance of a healthy diet and regular exercise in order for weight loss, and to reduce the risk of further co-morbidity.  Exam stable. Labs pending.  Follow up in 1 year for repeat physical.    Type 2 diabetes mellitus with hyperglycemia, without long-term current use of insulin (HCC) Assessment & Plan: Uncontrolled with A1C of 8.9. Poor diet and no regular exercise.   Discussed options, including  GLP 1 agonist treatment, he kindly declines at this time. Increase metformin XR to 750 twice daily.  He agrees.  He will work on his diet and increase physical activity when he can.  Urine microalbumin pending. Follow-up 3 months.  Orders: -     POCT glycosylated hemoglobin (Hb A1C) -     metFORMIN HCl ER; Take 1 tablet (750 mg total) by mouth 2 (two) times daily. for diabetes.  Dispense: 180 tablet; Refill: 1 -     Microalbumin /  creatinine urine ratio -     Lipid panel -     Comprehensive metabolic panel -     CBC  Essential hypertension Assessment & Plan: Above goal in the office, also on recheck. Home readings are better.  He will work on diet and exercise.  Continue to monitor.         Doreene Nest, NP

## 2023-07-14 NOTE — Patient Instructions (Signed)
Stop by the lab prior to leaving today. I will notify you of your results once received.   We changed your metformin dose to 750 mg.  Take 1 tablet by mouth twice daily. Continue to work on your diet and increase physical activity.  Please schedule a follow up visit for 3 months.  It was a pleasure to see you today!

## 2023-07-14 NOTE — Assessment & Plan Note (Signed)
Uncontrolled with A1C of 8.9. Poor diet and no regular exercise.   Discussed options, including GLP 1 agonist treatment, he kindly declines at this time. Increase metformin XR to 750 twice daily.  He agrees.  He will work on his diet and increase physical activity when he can.  Urine microalbumin pending. Follow-up 3 months.

## 2023-07-14 NOTE — Assessment & Plan Note (Signed)
Immunizations UTD.  Discussed the importance of a healthy diet and regular exercise in order for weight loss, and to reduce the risk of further co-morbidity.  Exam stable. Labs pending.  Follow up in 1 year for repeat physical.  

## 2023-11-03 ENCOUNTER — Ambulatory Visit: Payer: 59 | Admitting: Primary Care

## 2023-12-29 ENCOUNTER — Ambulatory Visit: Payer: 59 | Admitting: Primary Care

## 2024-01-06 ENCOUNTER — Other Ambulatory Visit: Payer: Self-pay | Admitting: Primary Care

## 2024-01-06 DIAGNOSIS — E1165 Type 2 diabetes mellitus with hyperglycemia: Secondary | ICD-10-CM

## 2024-01-21 ENCOUNTER — Ambulatory Visit: Payer: 59 | Admitting: Primary Care

## 2024-02-04 ENCOUNTER — Ambulatory Visit: Payer: 59 | Admitting: Primary Care

## 2024-02-15 ENCOUNTER — Ambulatory Visit: Payer: 59 | Admitting: Primary Care

## 2024-02-15 VITALS — BP 138/82 | HR 94 | Temp 97.8°F | Ht 74.0 in | Wt 260.0 lb

## 2024-02-15 DIAGNOSIS — E1165 Type 2 diabetes mellitus with hyperglycemia: Secondary | ICD-10-CM | POA: Diagnosis not present

## 2024-02-15 DIAGNOSIS — Z7984 Long term (current) use of oral hypoglycemic drugs: Secondary | ICD-10-CM | POA: Diagnosis not present

## 2024-02-15 LAB — POCT GLYCOSYLATED HEMOGLOBIN (HGB A1C): Hemoglobin A1C: 8.5 % — AB (ref 4.0–5.6)

## 2024-02-15 MED ORDER — GLIPIZIDE ER 10 MG PO TB24: 10.0000 mg | ORAL_TABLET | Freq: Every day | ORAL | 1 refills | Status: DC

## 2024-02-15 MED ORDER — METFORMIN HCL ER 500 MG PO TB24
500.0000 mg | ORAL_TABLET | Freq: Two times a day (BID) | ORAL | 1 refills | Status: DC
Start: 2024-02-15 — End: 2024-08-07

## 2024-02-15 NOTE — Assessment & Plan Note (Signed)
 Uncontrolled with A1c of 8.5 today.  Reviewed A1c from prior 2 years, discussed the absolute need to gain better control.  He agrees. We discussed options including GLP-1 agonist treatment, addition of sulfonylurea.  He declines GLP-1 agonist treatment for now.  Reduce metformin ER to 500 mg twice daily due to GI upset. Add glipizide XL 10 mg daily.  Foot exam today. He will schedule eye exam.  Follow-up in 3 months.

## 2024-02-15 NOTE — Patient Instructions (Signed)
 We changed your metformin to ER 500 mg twice daily.  Start glipizide XL 10 mg once daily with breakfast for diabetes.  Please schedule a follow up visit for 3 months.  It was a pleasure to see you today!

## 2024-02-15 NOTE — Progress Notes (Signed)
 Subjective:    Patient ID: David Horton, male    DOB: [birth year], 48 y.o.   MRN: [id number]  HPI  David Horton is a very pleasant 48 y.o. male with a history of type 2 diabetes, hypertension who presents today for follow-up of diabetes.  Current medications include: Metformin ER 750 mg twice daily. He has noticed GI upset with the 750 mg dose.   He is checking his blood glucose on occasion and gets readings in the 120-130s  Last A1C: 8.9 in July 2024, 8.5 today. Last Eye Exam: Due. He is aware.  Last Foot Exam: Due Pneumonia Vaccination: 2023 Urine Microalbumin: UTD Statin: None  Dietary changes since last visit: He has been cutting back on candy and sugary foods.   Exercise: None.   BP Readings from Last 3 Encounters:  02/15/24 138/82  07/14/23 (!) 154/88  03/12/23 136/82     Review of Systems  Eyes:  Negative for visual disturbance.  Respiratory:  Negative for shortness of breath.   Cardiovascular:  Negative for chest pain.  Neurological:  Negative for numbness.         Past Medical History:  Diagnosis Date   Chicken pox    Diabetes mellitus without complication (HCC)    GERD (gastroesophageal reflux disease)     Social History   Socioeconomic History   Marital status: Single    Spouse name: Not on file   Number of children: Not on file   Years of education: Not on file   Highest education level: Bachelor's degree (e.g., BA, AB, BS)  Occupational History   Not on file  Tobacco Use   Smoking status: Never   Smokeless tobacco: Never  Substance and Sexual Activity   Alcohol use: Yes    Comment: 2-4 drinks weekly   Drug use: No   Sexual activity: Not on file  Other Topics Concern   Not on file  Social History Narrative   Divorced.   2 children.   Works for [employer].   Enjoys working out.    Social Drivers of Corporate investment banker Strain: Low Risk  (02/15/2024)   Overall Financial Resource Strain  (CARDIA)    Difficulty of Paying Living Expenses: Not hard at all  Food Insecurity: No Food Insecurity (02/15/2024)   Hunger Vital Sign    Worried About Running Out of Food in the Last Year: Never true    Ran Out of Food in the Last Year: Never true  Transportation Needs: No Transportation Needs (02/15/2024)   PRAPARE - Administrator, Civil Service (Medical): No    Lack of Transportation (Non-Medical): No  Physical Activity: Insufficiently Active (02/15/2024)   Exercise Vital Sign    Days of Exercise per Week: 4 days    Minutes of Exercise per Session: 30 min  Stress: No Stress Concern Present (02/15/2024)   [employer] of Occupational Health - Occupational Stress Questionnaire    Feeling of Stress : Not at all  Social Connections: Moderately Isolated (02/15/2024)   Social Connection and Isolation Panel [NHANES]    Frequency of Communication with Friends and Family: Never    Frequency of Social Gatherings with Friends and Family: Never    Attends Religious Services: Never    Database administrator or Organizations: Yes    Attends Banker Meetings: 1 to 4 times per year    Marital Status: Living with partner  Intimate Partner Violence:  Not on file    Past Surgical History:  Procedure Laterality Date   SHOULDER SURGERY Left 2000, 2011   Left torn Labrum    Family History  Problem Relation Age of Onset   Diabetes Mellitus Horton Mother    COPD Father    Diabetes Mellitus Horton Father    Diabetes Mellitus I Sister    Diabetes Mellitus Horton Maternal Grandmother    Alzheimer's disease Maternal Grandmother     No Known Allergies  No current outpatient medications on file prior to visit.   No current facility-administered medications on file prior to visit.    BP 138/82   Pulse 94   Temp 97.8 F (36.6 C) (Temporal)   Ht 6\' 2"  (1.88 m)   Wt 260 lb (117.9 kg)   SpO2 99%   BMI 33.38 kg/m  Objective:   Physical Exam Cardiovascular:     Rate and Rhythm:  Normal rate and regular rhythm.  Pulmonary:     Effort: Pulmonary effort is normal.     Breath sounds: Normal breath sounds.  Musculoskeletal:     Cervical back: Neck supple.  Skin:    General: Skin is warm and dry.  Neurological:     Mental Status: He is alert and oriented to person, place, and time.  Psychiatric:        Mood and Affect: Mood normal.           Assessment & Plan:  Type 2 diabetes mellitus with hyperglycemia, without long-term current use of insulin (HCC) Assessment & Plan: Uncontrolled with A1c of 8.5 today.  Reviewed A1c from prior 2 years, discussed the absolute need to gain better control.  He agrees. We discussed options including GLP-1 agonist treatment, addition of sulfonylurea.  He declines GLP-1 agonist treatment for now.  Reduce metformin ER to 500 mg twice daily due to GI upset. Add glipizide XL 10 mg daily.  Foot exam today. He will schedule eye exam.  Follow-up in 3 months.  Orders: -     POCT glycosylated hemoglobin (Hb A1C) -     metFORMIN HCl ER; Take 1 tablet (500 mg total) by mouth 2 (two) times daily with a meal. for diabetes.  Dispense: 180 tablet; Refill: 1 -     glipiZIDE ER; Take 1 tablet (10 mg total) by mouth daily with breakfast. for diabetes.  Dispense: 90 tablet; Refill: 1        Doreene Nest, NP

## 2024-03-15 ENCOUNTER — Other Ambulatory Visit: Payer: Self-pay | Admitting: Primary Care

## 2024-03-15 DIAGNOSIS — E1165 Type 2 diabetes mellitus with hyperglycemia: Secondary | ICD-10-CM

## 2024-05-17 ENCOUNTER — Ambulatory Visit: Admitting: Primary Care

## 2024-05-18 ENCOUNTER — Ambulatory Visit: Admitting: Primary Care

## 2024-05-18 ENCOUNTER — Ambulatory Visit: Payer: Self-pay | Admitting: Primary Care

## 2024-05-18 ENCOUNTER — Encounter: Payer: Self-pay | Admitting: Primary Care

## 2024-05-18 VITALS — BP 144/86 | HR 72 | Temp 97.0°F | Ht 74.0 in | Wt 251.0 lb

## 2024-05-18 DIAGNOSIS — E1165 Type 2 diabetes mellitus with hyperglycemia: Secondary | ICD-10-CM

## 2024-05-18 DIAGNOSIS — Z7984 Long term (current) use of oral hypoglycemic drugs: Secondary | ICD-10-CM | POA: Diagnosis not present

## 2024-05-18 LAB — POCT GLYCOSYLATED HEMOGLOBIN (HGB A1C): Hemoglobin A1C: 7.6 % — AB (ref 4.0–5.6)

## 2024-05-18 MED ORDER — FREESTYLE LIBRE 3 PLUS SENSOR MISC: 1 refills | Status: AC

## 2024-05-18 NOTE — Patient Instructions (Signed)
 Continue to work on improving your diet.   Start exercising. You should be getting 150 minutes of moderate intensity exercise weekly.  Please schedule a physical to meet with me in 3 months.   It was a pleasure to see you today!

## 2024-05-18 NOTE — Assessment & Plan Note (Addendum)
 Improving.   Continue metformin  ER 500 mg BID, Glipizide  XL 10 mg daily. He will work on lifestyle changes to reduce A1C <7.  Rx for [employer] 3 plus sent to pharmacy per patient request.  He will schedule an eye exam.  Follow up in 3 months.

## 2024-05-18 NOTE — Progress Notes (Signed)
 Subjective:    Patient ID: David Horton, male    DOB: Jun 10, 1976, 48 y.o.   MRN: [id number]  HPI  David Horton is a very pleasant 48 y.o. male with a history of hypertension, type 2 diabetes who presents today for follow-up of diabetes.  Current medications include: Metformin  ER 500 mg twice daily, glipizide  XL 10 mg daily  He is checking his blood glucose 0 times daily.  Last A1C: 8.5 in March 2025, 7.6 today.  Last Eye Exam: Due Last Foot Exam: Up-to-date Pneumonia Vaccination: 2023 Urine Microalbumin: Up-to-date Statin: None, has declined.  Dietary changes since last visit: Reduced fried food, cutting back on processed sugary foods.    Exercise: Some  BP Readings from Last 3 Encounters:  05/18/24 (!) 144/86  02/15/24 138/82  07/14/23 (!) 154/88     Review of Systems  Eyes:  Negative for visual disturbance.  Respiratory:  Negative for shortness of breath.   Cardiovascular:  Negative for chest pain.  Neurological:  Positive for numbness.         Past Medical History:  Diagnosis Date   Chicken pox    Diabetes mellitus without complication (HCC)    GERD (gastroesophageal reflux disease)     Social History   Socioeconomic History   Marital status: Single    Spouse name: Not on file   Number of children: Not on file   Years of education: Not on file   Highest education level: Bachelor's degree (e.g., BA, AB, BS)  Occupational History   Not on file  Tobacco Use   Smoking status: Never   Smokeless tobacco: Never  Substance and Sexual Activity   Alcohol use: Yes    Comment: 2-4 drinks weekly   Drug use: No   Sexual activity: Not on file  Other Topics Concern   Not on file  Social History Narrative   Divorced.   2 children.   Works for [employer].   Enjoys working out.    Social Drivers of Corporate investment banker Strain: Low Risk  (02/15/2024)   Overall Financial Resource Strain (CARDIA)    Difficulty of  Paying Living Expenses: Not hard at all  Food Insecurity: No Food Insecurity (02/15/2024)   Hunger Vital Sign    Worried About Running Out of Food in the Last Year: Never true    Ran Out of Food in the Last Year: Never true  Transportation Needs: No Transportation Needs (02/15/2024)   PRAPARE - Administrator, Civil Service (Medical): No    Lack of Transportation (Non-Medical): No  Physical Activity: Insufficiently Active (02/15/2024)   Exercise Vital Sign    Days of Exercise per Week: 4 days    Minutes of Exercise per Session: 30 min  Stress: No Stress Concern Present (02/15/2024)   [employer] of Occupational Health - Occupational Stress Questionnaire    Feeling of Stress : Not at all  Social Connections: Moderately Isolated (02/15/2024)   Social Connection and Isolation Panel [NHANES]    Frequency of Communication with Friends and Family: Never    Frequency of Social Gatherings with Friends and Family: Never    Attends Religious Services: Never    Database administrator or Organizations: Yes    Attends Banker Meetings: 1 to 4 times per year    Marital Status: Living with partner  Intimate Partner Violence: Not on file    Past Surgical History:  Procedure Laterality  Date   SHOULDER SURGERY Left 2000, 2011   Left torn Labrum    Family History  Problem Relation Age of Onset   Diabetes Mellitus Horton Mother    COPD Father    Diabetes Mellitus Horton Father    Diabetes Mellitus I Sister    Diabetes Mellitus Horton Maternal Grandmother    Alzheimer's disease Maternal Grandmother     No Known Allergies  Current Outpatient Medications on File Prior to Visit  Medication Sig Dispense Refill   glipiZIDE  (GLUCOTROL  XL) 10 MG 24 hr tablet Take 1 tablet (10 mg total) by mouth daily with breakfast. for diabetes. 90 tablet 1   metFORMIN  (GLUCOPHAGE -XR) 500 MG 24 hr tablet Take 1 tablet (500 mg total) by mouth 2 (two) times daily with a meal. for diabetes. 180 tablet 1    No current facility-administered medications on file prior to visit.    BP (!) 144/86   Pulse 72   Temp (!) 97 F (36.1 C) (Temporal)   Ht 6\' 2"  (1.88 m)   Wt 251 lb (113.9 kg)   SpO2 98%   BMI 32.23 kg/m  Objective:   Physical Exam Cardiovascular:     Rate and Rhythm: Normal rate and regular rhythm.  Pulmonary:     Effort: Pulmonary effort is normal.     Breath sounds: Normal breath sounds.  Musculoskeletal:     Cervical back: Neck supple.  Skin:    General: Skin is warm and dry.  Neurological:     Mental Status: He is alert and oriented to person, place, and time.  Psychiatric:        Mood and Affect: Mood normal.           Assessment & Plan:  Type 2 diabetes mellitus with hyperglycemia, without long-term current use of insulin (HCC) Assessment & Plan: Improving.   Continue metformin  ER 500 mg BID, Glipizide  XL 10 mg daily. He will work on lifestyle changes to reduce A1C <7.  Rx for [employer] 3 plus sent to pharmacy per patient request.  He will schedule an eye exam.  Follow up in 3 months.  Orders: -     POCT glycosylated hemoglobin (Hb A1C) -     FreeStyle Libre 3 Plus Sensor; Use to check blood sugar continuously. Change sensor every 15 days.  Dispense: 6 each; Refill: 1        Gabriel John, NP

## 2024-05-24 ENCOUNTER — Other Ambulatory Visit (HOSPITAL_COMMUNITY): Payer: Self-pay

## 2024-05-24 ENCOUNTER — Telehealth: Payer: Self-pay

## 2024-05-24 NOTE — Telephone Encounter (Signed)
 Pharmacy Patient Advocate Encounter   Received notification from CoverMyMeds that prior authorization for FreeStyle Libre 3 Plus Sensor is required/requested.   Insurance verification completed.   The patient is insured through [facility] .   Per test claim: PA required; PA submitted to above mentioned insurance via CoverMyMeds Key/confirmation #/EOC [facility] Status is pending

## 2024-05-25 ENCOUNTER — Other Ambulatory Visit: Payer: Self-pay | Admitting: Primary Care

## 2024-05-25 DIAGNOSIS — E1165 Type 2 diabetes mellitus with hyperglycemia: Secondary | ICD-10-CM

## 2024-05-25 NOTE — Telephone Encounter (Signed)
 Pharmacy Patient Advocate Encounter  Received notification from [employer] that Prior Authorization for FreeStyle Libre 3 Plus Sensor  has been DENIED.  Full denial letter will be uploaded to the media tab. See denial reason below.   PA #/Case ID/Reference #: [id number]

## 2024-05-25 NOTE — Telephone Encounter (Signed)
 Noted

## 2024-08-06 ENCOUNTER — Other Ambulatory Visit: Payer: Self-pay | Admitting: Primary Care

## 2024-08-06 DIAGNOSIS — E1165 Type 2 diabetes mellitus with hyperglycemia: Secondary | ICD-10-CM

## 2024-08-17 ENCOUNTER — Encounter: Payer: Self-pay | Admitting: Primary Care

## 2024-08-17 ENCOUNTER — Ambulatory Visit (INDEPENDENT_AMBULATORY_CARE_PROVIDER_SITE_OTHER): Admitting: Primary Care

## 2024-08-17 VITALS — BP 150/84 | HR 75 | Temp 97.4°F | Ht 74.0 in | Wt 252.0 lb

## 2024-08-17 DIAGNOSIS — E1165 Type 2 diabetes mellitus with hyperglycemia: Secondary | ICD-10-CM

## 2024-08-17 DIAGNOSIS — Z7984 Long term (current) use of oral hypoglycemic drugs: Secondary | ICD-10-CM

## 2024-08-17 DIAGNOSIS — Z0000 Encounter for general adult medical examination without abnormal findings: Secondary | ICD-10-CM

## 2024-08-17 DIAGNOSIS — Z1211 Encounter for screening for malignant neoplasm of colon: Secondary | ICD-10-CM

## 2024-08-17 DIAGNOSIS — I10 Essential (primary) hypertension: Secondary | ICD-10-CM | POA: Diagnosis not present

## 2024-08-17 LAB — LIPID PANEL
Cholesterol: 155 mg/dL (ref 0–200)
HDL: 36.1 mg/dL — ABNORMAL LOW (ref >39.00)
LDL Cholesterol: 88 mg/dL (ref 0–99)
NonHDL: 119.37
Total CHOL/HDL Ratio: 4
Triglycerides: 158 mg/dL — ABNORMAL HIGH (ref 0.0–149.0)
VLDL: 31.6 mg/dL (ref 0.0–40.0)

## 2024-08-17 LAB — MICROALBUMIN / CREATININE URINE RATIO
Creatinine,U: 127.7 mg/dL
Microalb Creat Ratio: 12.5 mg/g (ref 0.0–30.0)
Microalb, Ur: 1.6 mg/dL (ref 0.0–1.9)

## 2024-08-17 LAB — COMPREHENSIVE METABOLIC PANEL WITH GFR
ALT: 41 U/L (ref 0–53)
AST: 25 U/L (ref 0–37)
Albumin: 4.3 g/dL (ref 3.5–5.2)
Alkaline Phosphatase: 49 U/L (ref 39–117)
BUN: 19 mg/dL (ref 6–23)
CO2: 28 meq/L (ref 19–32)
Calcium: 8.9 mg/dL (ref 8.4–10.5)
Chloride: 101 meq/L (ref 96–112)
Creatinine, Ser: 0.65 mg/dL (ref 0.40–1.50)
GFR: 113.49 mL/min (ref >60.00)
Glucose, Bld: 235 mg/dL — ABNORMAL HIGH (ref 70–99)
Potassium: 4.5 meq/L (ref 3.5–5.1)
Sodium: 135 meq/L (ref 135–145)
Total Bilirubin: 1.1 mg/dL (ref 0.2–1.2)
Total Protein: 7.3 g/dL (ref 6.0–8.3)

## 2024-08-17 LAB — HEMOGLOBIN A1C: Hgb A1c MFr Bld: 9.2 % — ABNORMAL HIGH (ref 4.6–6.5)

## 2024-08-17 NOTE — Assessment & Plan Note (Signed)
 Above goal today, also on recheck.  Home readings are within range.  Discussed to continue to monitor at home and report if readings are consistently at or above 140/90.  He agrees.

## 2024-08-17 NOTE — Assessment & Plan Note (Signed)
 Repeat A1c pending today.  Urine microalbumin pending.  Continue metformin  ER 500 mg twice daily, glipizide  XL 10 mg daily.  Follow-up in 3 to 6 months based on A1c report.

## 2024-08-17 NOTE — Assessment & Plan Note (Signed)
Immunizations UTD. Colonoscopy due, referral placed to GI.  Discussed the importance of a healthy diet and regular exercise in order for weight loss, and to reduce the risk of further co-morbidity.  Exam stable. Labs pending.  Follow up in 1 year for repeat physical.  

## 2024-08-17 NOTE — Patient Instructions (Signed)
 Stop by the lab prior to leaving today. I will notify you of your results once received.   You will receive a phone call regarding the colonoscopy.  Please schedule a follow up visit for 6 months for a diabetes check.  It was a pleasure to see you today!

## 2024-08-17 NOTE — Progress Notes (Signed)
 Subjective:    Patient ID: David Horton, male    DOB: 1975-12-17, 48 y.o.   MRN: [id number]  David Horton is a very pleasant 48 y.o. male with a history of type 2 diabetes, hypertension who presents today for complete physical and follow up of chronic conditions.  Immunizations: -Tetanus: Completed in 2019 -Pneumonia: Completed in 2023  Diet: Fair diet.  Exercise: No regular exercise.  Eye exam: Completes annually, due and will schedule. Dental exam: Completed > 10 years ago  Colonoscopy: Never completed,   BP Readings from Last 3 Encounters:  08/17/24 (!) 150/84  05/18/24 (!) 144/86  02/15/24 138/82   Wt Readings from Last 3 Encounters:  08/17/24 252 lb (114.3 kg)  05/18/24 251 lb (113.9 kg)  02/15/24 260 lb (117.9 kg)   He checks his BP at home which runs 130/70.     Review of Systems  Constitutional:  Negative for unexpected weight change.  HENT:  Negative for rhinorrhea.   Respiratory:  Negative for cough and shortness of breath.   Cardiovascular:  Negative for chest pain.  Gastrointestinal:  Negative for constipation and diarrhea.  Endocrine: Negative for polydipsia.  Genitourinary:  Negative for difficulty urinating.  Musculoskeletal:  Negative for arthralgias.  Skin:  Negative for rash.  Allergic/Immunologic: Negative for environmental allergies.  Neurological:  Negative for dizziness and headaches.  Psychiatric/Behavioral:  The patient is nervous/anxious.          Past Medical History:  Diagnosis Date   Chicken pox    Diabetes mellitus without complication (HCC)    GERD (gastroesophageal reflux disease)     Social History   Socioeconomic History   Marital status: Single    Spouse name: Not on file   Number of children: Not on file   Years of education: Not on file   Highest education level: Bachelor's degree (e.g., BA, AB, BS)  Occupational History   Not on file  Tobacco Use   Smoking status: Never   Smokeless tobacco:  Never  Substance and Sexual Activity   Alcohol use: Yes    Comment: 2-4 drinks weekly   Drug use: No   Sexual activity: Not on file  Other Topics Concern   Not on file  Social History Narrative   Divorced.   2 children.   Works for [employer].   Enjoys working out.    Social Drivers of Corporate investment banker Strain: Low Risk  (08/16/2024)   Overall Financial Resource Strain (CARDIA)    Difficulty of Paying Living Expenses: Not hard at all  Food Insecurity: No Food Insecurity (08/16/2024)   Hunger Vital Sign    Worried About Running Out of Food in the Last Year: Never true    Ran Out of Food in the Last Year: Never true  Transportation Needs: No Transportation Needs (08/16/2024)   PRAPARE - Administrator, Civil Service (Medical): No    Lack of Transportation (Non-Medical): No  Physical Activity: Sufficiently Active (08/16/2024)   Exercise Vital Sign    Days of Exercise per Week: 5 days    Minutes of Exercise per Session: 30 min  Stress: No Stress Concern Present (08/16/2024)   [employer] of Occupational Health - Occupational Stress Questionnaire    Feeling of Stress: Not at all  Social Connections: Socially Isolated (08/16/2024)   Social Connection and Isolation Panel    Frequency of Communication with Friends and Family: Never    Frequency  of Social Gatherings with Friends and Family: Never    Attends Religious Services: Never    Database administrator or Organizations: Yes    Attends Engineer, structural: More than 4 times per year    Marital Status: Divorced  Catering manager Violence: Not on file    Past Surgical History:  Procedure Laterality Date   SHOULDER SURGERY Left 2000, 2011   Left torn Labrum    Family History  Problem Relation Age of Onset   Diabetes Mellitus Horton Mother    COPD Father    Diabetes Mellitus Horton Father    Diabetes Mellitus I Sister    Diabetes Mellitus Horton Maternal Grandmother    Alzheimer's  disease Maternal Grandmother     No Known Allergies  Current Outpatient Medications on File Prior to Visit  Medication Sig Dispense Refill   glipiZIDE  (GLUCOTROL  XL) 10 MG 24 hr tablet TAKE 1 TABLET (10 MG TOTAL) BY MOUTH DAILY WITH BREAKFAST. FOR DIABETES. 90 tablet 0   metFORMIN  (GLUCOPHAGE -XR) 500 MG 24 hr tablet TAKE 1 TABLET (500 MG TOTAL) BY MOUTH 2 (TWO) TIMES DAILY WITH A MEAL. FOR DIABETES. 180 tablet 0   Continuous Glucose Sensor (FREESTYLE LIBRE 3 PLUS SENSOR) MISC Use to check blood sugar continuously. Change sensor every 15 days. (Patient not taking: Reported on 08/17/2024) 6 each 1   No current facility-administered medications on file prior to visit.    BP (!) 150/84   Pulse 75   Temp (!) 97.4 F (36.3 C) (Temporal)   Ht 6' 2 (1.88 m)   Wt 252 lb (114.3 kg)   SpO2 100%   BMI 32.35 kg/m  Objective:   Physical Exam HENT:     Right Ear: Tympanic membrane and ear canal normal.     Left Ear: Tympanic membrane and ear canal normal.  Eyes:     Pupils: Pupils are equal, round, and reactive to light.  Cardiovascular:     Rate and Rhythm: Normal rate and regular rhythm.  Pulmonary:     Effort: Pulmonary effort is normal.     Breath sounds: Normal breath sounds.  Abdominal:     General: Bowel sounds are normal.     Palpations: Abdomen is soft.     Tenderness: There is no abdominal tenderness.  Musculoskeletal:        General: Normal range of motion.     Cervical back: Neck supple.  Skin:    General: Skin is warm and dry.  Neurological:     Mental Status: He is alert and oriented to person, place, and time.     Cranial Nerves: No cranial nerve deficit.     Deep Tendon Reflexes:     Reflex Scores:      Patellar reflexes are 2+ on the right side and 2+ on the left side. Psychiatric:        Mood and Affect: Mood normal.     Physical Exam        Assessment & Plan:  Preventative health care Assessment & Plan: Immunizations UTD. Colonoscopy due, referral  placed to GI  Discussed the importance of a healthy diet and regular exercise in order for weight loss, and to reduce the risk of further co-morbidity.  Exam stable. Labs pending.  Follow up in 1 year for repeat physical.    Screening for colon cancer -     Ambulatory referral to Gastroenterology  Type 2 diabetes mellitus with hyperglycemia, without long-term current use of insulin (  HCC) Assessment & Plan: Repeat A1c pending today.  Urine microalbumin pending.  Continue metformin  ER 500 mg twice daily, glipizide  XL 10 mg daily.  Follow-up in 3 to 6 months based on A1c report.  Orders: -     Hemoglobin A1c -     Microalbumin / creatinine urine ratio  Essential hypertension Assessment & Plan: Above goal today, also on recheck.  Home readings are within range.  Discussed to continue to monitor at home and report if readings are consistently at or above 140/90.  He agrees.  Orders: -     Lipid panel -     Comprehensive metabolic panel with GFR    Assessment and Plan Assessment & Plan         Comer MARLA Gaskins, NP      History of Present Illness

## 2024-08-18 ENCOUNTER — Ambulatory Visit: Payer: Self-pay | Admitting: Primary Care

## 2024-08-18 DIAGNOSIS — E1165 Type 2 diabetes mellitus with hyperglycemia: Secondary | ICD-10-CM

## 2024-08-26 MED ORDER — METFORMIN HCL ER 500 MG PO TB24: 1000.0000 mg | ORAL_TABLET | Freq: Two times a day (BID) | ORAL | 1 refills | Status: DC

## 2024-09-13 ENCOUNTER — Encounter: Payer: Self-pay | Admitting: Pediatrics

## 2024-10-11 ENCOUNTER — Encounter

## 2024-10-25 ENCOUNTER — Encounter: Admitting: Pediatrics

## 2024-11-22 ENCOUNTER — Other Ambulatory Visit: Payer: Self-pay | Admitting: Primary Care

## 2024-11-22 DIAGNOSIS — E1165 Type 2 diabetes mellitus with hyperglycemia: Secondary | ICD-10-CM

## 2025-02-17 ENCOUNTER — Ambulatory Visit: Admitting: Primary Care
